# Patient Record
Sex: Female | Born: 1995 | Race: Black or African American | Hispanic: No | Marital: Single | Smoking: Current every day smoker
Health system: Southern US, Community
[De-identification: ages and names within clinical notes are randomized; demographics above are authoritative.]

## PROBLEM LIST (undated history)

## (undated) DIAGNOSIS — N939 Abnormal uterine and vaginal bleeding, unspecified: Secondary | ICD-10-CM

## (undated) DIAGNOSIS — L0291 Cutaneous abscess, unspecified: Secondary | ICD-10-CM

## (undated) HISTORY — DX: Cutaneous abscess, unspecified: L02.91

## (undated) HISTORY — PX: TONSILLECTOMY: SUR1361

---
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
## 2017-11-18 ENCOUNTER — Encounter (HOSPITAL_BASED_OUTPATIENT_CLINIC_OR_DEPARTMENT_OTHER): Payer: Self-pay | Admitting: *Deleted

## 2017-11-18 ENCOUNTER — Other Ambulatory Visit: Payer: Self-pay

## 2017-11-18 ENCOUNTER — Emergency Department (HOSPITAL_BASED_OUTPATIENT_CLINIC_OR_DEPARTMENT_OTHER)
Admission: EM | Admit: 2017-11-18 | Discharge: 2017-11-18 | Disposition: A | Discharge status: Home / Self Care | Payer: Medicaid Other | Attending: Emergency Medicine | Admitting: Emergency Medicine

## 2017-11-18 DIAGNOSIS — Z79899 Other long term (current) drug therapy: Secondary | ICD-10-CM | POA: Insufficient documentation

## 2017-11-18 DIAGNOSIS — R109 Unspecified abdominal pain: Secondary | ICD-10-CM | POA: Insufficient documentation

## 2017-11-18 DIAGNOSIS — N939 Abnormal uterine and vaginal bleeding, unspecified: Secondary | ICD-10-CM | POA: Diagnosis present

## 2017-11-18 DIAGNOSIS — M549 Dorsalgia, unspecified: Secondary | ICD-10-CM | POA: Insufficient documentation

## 2017-11-18 LAB — URINALYSIS, MICROSCOPIC (REFLEX)

## 2017-11-18 LAB — WET PREP, GENITAL
CLUE CELLS WET PREP: NONE SEEN
SPERM: NONE SEEN
TRICH WET PREP: NONE SEEN
YEAST WET PREP: NONE SEEN

## 2017-11-18 LAB — URINALYSIS, ROUTINE W REFLEX MICROSCOPIC
Bilirubin Urine: NEGATIVE
Glucose, UA: NEGATIVE mg/dL
Ketones, ur: NEGATIVE mg/dL
LEUKOCYTES UA: NEGATIVE
Nitrite: NEGATIVE
PROTEIN: NEGATIVE mg/dL
SPECIFIC GRAVITY, URINE: 1.01 (ref 1.005–1.030)
pH: 6 (ref 5.0–8.0)

## 2017-11-18 LAB — PREGNANCY, URINE: Preg Test, Ur: NEGATIVE

## 2017-11-18 NOTE — ED Notes (Addendum)
Patient stated that she has bilateral flank pain and vaginal bleeding  that started at 1115.  Pt claimed that it hurts intermittently.  Pt went to [facility] 4 or 5 months ago d/t heavy bleeding and MD stated that it probably because of her age.

## 2017-11-18 NOTE — ED Provider Notes (Signed)
[facility] EMERGENCY DEPARTMENT Provider Note   CSN: [id number] Arrival date & time: 11/18/17  1217     History   Chief Complaint Chief Complaint  Patient presents with  . Abdominal Pain  . Vaginal Bleeding    HPI Daralyn Bert is a 22 y.o. female who presents with back pain and vaginal bleeding.  She states that she had a period approximately 10 days ago which was normal.  Today she started having vaginal bleeding.  She has  seen several clots when she goes to the bathroom.  Additionally she has had bilateral flank pain.  This is intermittent and not very painful.  Nothing makes it better or worse.  She states she was seen at [facility] in August or something similar and had an ultrasound.  She was given oral contraceptives and her symptoms improved.  She has not had a recurrence of this issue until now.  She is sexually active with one partner and does not use protection.  She does not use birth control.  She denies fever, abdominal pain, nausea, vomiting, blood in the stool, blood in the urine, vaginal discharge.  HPI  History reviewed. No pertinent past medical history.  There are no active problems to display for this patient.   Past Surgical History:  Procedure Laterality Date  . TONSILLECTOMY       OB History   None      Home Medications    Prior to Admission medications   Medication Sig Start Date End Date Taking? Authorizing Provider  albuterol (PROVENTIL HFA;VENTOLIN HFA) 108 (90 BASE) MCG/ACT inhaler Inhale 2 puffs into the lungs every 4 (four) hours as needed for wheezing. 07/03/12   Rancour, Jeannett Senior, MD  azithromycin (ZITHROMAX Z-PAK) 250 MG tablet 2 tabs PO today, then 1 tab PO daily 07/03/12   Glynn Octave, MD    Family History No family history on file.  Social History Social History   Tobacco Use  . Smoking status: Never Smoker  . Smokeless tobacco: Current User  Substance Use Topics  . Alcohol use: No  . Drug  use: No     Allergies   Patient has no known allergies.   Review of Systems Review of Systems  Constitutional: Negative for fever.  Respiratory: Negative for shortness of breath.   Cardiovascular: Negative for chest pain.  Gastrointestinal: Negative for abdominal pain, blood in stool, diarrhea, nausea and vomiting.  Genitourinary: Positive for flank pain and vaginal bleeding. Negative for difficulty urinating, dyspareunia, dysuria, hematuria, pelvic pain, vaginal discharge and vaginal pain.  Hematological: Does not bruise/bleed easily.  All other systems reviewed and are negative.    Physical Exam Updated Vital Signs BP 122/85   Pulse 76   Temp 98.2 F (36.8 C) (Oral)   Resp 20   Ht 5\' 2"  (1.575 m)   Wt 95.3 kg (210 lb)   LMP 11/18/2017   SpO2 100%   BMI 38.41 kg/m   Physical Exam  Constitutional: She is oriented to person, place, and time. She appears well-developed and well-nourished. No distress.  Calm, cooperative, overweight female in no acute distress  HENT:  Head: Normocephalic and atraumatic.  Eyes: Pupils are equal, round, and reactive to light. Conjunctivae are normal. Right eye exhibits no discharge. Left eye exhibits no discharge. No scleral icterus.  Neck: Normal range of motion.  Cardiovascular: Normal rate and regular rhythm.  Pulmonary/Chest: Effort normal and breath sounds normal. No respiratory distress.  Abdominal: Soft. Bowel sounds are  normal. She exhibits no distension. There is no tenderness.  No CVA tenderness  Genitourinary:  Genitourinary Comments: No inguinal lymphadenopathy or inguinal hernia noted. Normal external genitalia. No pain with speculum insertion. Closed cervical os with normal appearance - no rash or lesions. Moderate amount of bleeding in vaginal vault. On bimanual examination no adnexal tenderness or cervical motion tenderness. Chaperone present during exam.    Neurological: She is alert and oriented to person, place, and time.   Skin: Skin is warm and dry.  Psychiatric: She has a normal mood and affect. Her behavior is normal.  Nursing note and vitals reviewed.    ED Treatments / Results  Labs (all labs ordered are listed, but only abnormal results are displayed) Labs Reviewed  WET PREP, GENITAL - Abnormal; Notable for the following components:      Result Value   WBC, Wet Prep HPF POC FEW (*)    All other components within normal limits  URINALYSIS, ROUTINE W REFLEX MICROSCOPIC - Abnormal; Notable for the following components:   APPearance HAZY (*)    Hgb urine dipstick LARGE (*)    All other components within normal limits  URINALYSIS, MICROSCOPIC (REFLEX) - Abnormal; Notable for the following components:   Bacteria, UA RARE (*)    Squamous Epithelial / LPF 0-5 (*)    All other components within normal limits  PREGNANCY, URINE  GC/CHLAMYDIA PROBE AMP ([place]    EKG None  Radiology No results found.  Procedures Procedures (including critical care time)  Medications Ordered in ED Medications - No data to display   Initial Impression / Assessment and Plan / ED Course  I have reviewed the triage vital signs and the nursing notes.  Pertinent labs & imaging results that were available during my care of the patient were reviewed by me and considered in my medical decision making (see chart for details).  22 year old female presents with vaginal bleeding.  Her vital signs are normal.  On exam she is well-appearing.  She has no CVA tenderness her abdomen is nontender she has no pelvic tenderness.  She does have bleeding from the cervix which is mild to moderate.  She is not pregnant.  Her urinalysis does not show evidence of infection.  Her wet prep is normal.  Gonorrhea and chlamydia were sent off.  She was advised to take ibuprofen and to follow-up with her OB/GYN.  She was advised to return if bleeding is persistent or worsening.  Final Clinical Impressions(s) / ED Diagnoses    Final diagnoses:  Vaginal bleeding    ED Discharge Orders    None       Bethel BornGekas, Kelly Marie, PA-C 11/18/17 1524    Raeford RazorKohut, Stephen, MD 11/19/17 [phone]

## 2017-11-18 NOTE — ED Triage Notes (Signed)
Abdominal pain and abnormal vaginal bleeding. States she had a period 10 days ago and today she is having another period per pt.

## 2017-11-18 NOTE — Discharge Instructions (Signed)
Please take Ibuprofen for bleeding/pain Return if worsening

## 2017-11-19 LAB — GC/CHLAMYDIA PROBE AMP (~~LOC~~) NOT AT ARMC
Chlamydia: NEGATIVE
Neisseria Gonorrhea: NEGATIVE

## 2017-12-15 ENCOUNTER — Emergency Department (HOSPITAL_COMMUNITY)
Admission: EM | Admit: 2017-12-15 | Discharge: 2017-12-15 | Disposition: A | Discharge status: Home / Self Care | Payer: Medicaid Other | Attending: Emergency Medicine | Admitting: Emergency Medicine

## 2017-12-15 ENCOUNTER — Emergency Department (HOSPITAL_COMMUNITY): Payer: Medicaid Other

## 2017-12-15 ENCOUNTER — Encounter (HOSPITAL_COMMUNITY): Payer: Self-pay | Admitting: Radiology

## 2017-12-15 DIAGNOSIS — M542 Cervicalgia: Secondary | ICD-10-CM | POA: Diagnosis not present

## 2017-12-15 DIAGNOSIS — S0990XA Unspecified injury of head, initial encounter: Secondary | ICD-10-CM | POA: Diagnosis present

## 2017-12-15 DIAGNOSIS — S299XXA Unspecified injury of thorax, initial encounter: Secondary | ICD-10-CM | POA: Insufficient documentation

## 2017-12-15 DIAGNOSIS — S199XXA Unspecified injury of neck, initial encounter: Secondary | ICD-10-CM | POA: Insufficient documentation

## 2017-12-15 DIAGNOSIS — Y9389 Activity, other specified: Secondary | ICD-10-CM | POA: Diagnosis not present

## 2017-12-15 DIAGNOSIS — Y92410 Unspecified street and highway as the place of occurrence of the external cause: Secondary | ICD-10-CM | POA: Diagnosis not present

## 2017-12-15 DIAGNOSIS — Y999 Unspecified external cause status: Secondary | ICD-10-CM | POA: Diagnosis not present

## 2017-12-15 DIAGNOSIS — S3991XA Unspecified injury of abdomen, initial encounter: Secondary | ICD-10-CM | POA: Diagnosis not present

## 2017-12-15 DIAGNOSIS — S161XXA Strain of muscle, fascia and tendon at neck level, initial encounter: Secondary | ICD-10-CM | POA: Insufficient documentation

## 2017-12-15 LAB — I-STAT CHEM 8, ED
BUN: 5 mg/dL — ABNORMAL LOW (ref 6–20)
CHLORIDE: 104 mmol/L (ref 101–111)
CREATININE: 0.7 mg/dL (ref 0.44–1.00)
Calcium, Ion: 1.18 mmol/L (ref 1.15–1.40)
GLUCOSE: 87 mg/dL (ref 65–99)
HCT: 41 % (ref 36.0–46.0)
HEMOGLOBIN: 13.9 g/dL (ref 12.0–15.0)
POTASSIUM: 3.7 mmol/L (ref 3.5–5.1)
Sodium: 141 mmol/L (ref 135–145)
TCO2: 22 mmol/L (ref 22–32)

## 2017-12-15 LAB — I-STAT BETA HCG BLOOD, ED (MC, WL, AP ONLY): I-stat hCG, quantitative: <5 m[IU]/mL (ref <5)

## 2017-12-15 MED ORDER — CYCLOBENZAPRINE HCL 10 MG PO TABS: 10.0000 mg | ORAL_TABLET | Freq: Three times a day (TID) | ORAL | 0 refills | Status: DC | PRN

## 2017-12-15 MED ORDER — MORPHINE SULFATE (PF) 4 MG/ML IV SOLN
4.0000 mg | Freq: Once | INTRAVENOUS | Status: AC
  Administered 2017-12-15: 4 mg via INTRAVENOUS
  Filled 2017-12-15: qty 1

## 2017-12-15 MED ORDER — IBUPROFEN 800 MG PO TABS: 800.0000 mg | ORAL_TABLET | Freq: Three times a day (TID) | ORAL | 0 refills | Status: DC | PRN

## 2017-12-15 MED ORDER — IOHEXOL 300 MG/ML  SOLN
100.0000 mL | Freq: Once | INTRAMUSCULAR | Status: AC | PRN
  Administered 2017-12-15: 100 mL via INTRAVENOUS
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
## 2017-12-15 NOTE — ED Notes (Signed)
Patient transported to MRI 

## 2017-12-15 NOTE — ED Notes (Signed)
Patient transported to X-ray 

## 2017-12-15 NOTE — ED Notes (Signed)
Patient transported to CT 

## 2017-12-15 NOTE — ED Notes (Signed)
Two unsuccessful attempts on starting a saline lock line.

## 2017-12-15 NOTE — ED Notes (Signed)
Pt ambulated self efficiently to restroom with no difficulty. 
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
## 2017-12-15 NOTE — Discharge Instructions (Addendum)
Your imaging is negative for serious traumatic injury.  Follow-up with your doctor.  Take ibuprofen or Tylenol as needed for pain.  Return to the ED if you develop new or worsening symptoms.

## 2017-12-15 NOTE — ED Notes (Signed)
ED Provider at bedside. 
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
## 2018-01-24 ENCOUNTER — Other Ambulatory Visit (HOSPITAL_COMMUNITY): Payer: Self-pay | Admitting: Family

## 2019-07-02 ENCOUNTER — Emergency Department (HOSPITAL_BASED_OUTPATIENT_CLINIC_OR_DEPARTMENT_OTHER)
Admission: EM | Admit: 2019-07-02 | Discharge: 2019-07-02 | Disposition: A | Discharge status: Home / Self Care | Payer: Medicaid Other | Attending: Emergency Medicine | Admitting: Emergency Medicine

## 2019-07-02 ENCOUNTER — Other Ambulatory Visit: Payer: Self-pay

## 2019-07-02 ENCOUNTER — Encounter (HOSPITAL_BASED_OUTPATIENT_CLINIC_OR_DEPARTMENT_OTHER): Payer: Self-pay

## 2019-07-02 DIAGNOSIS — N939 Abnormal uterine and vaginal bleeding, unspecified: Secondary | ICD-10-CM | POA: Insufficient documentation

## 2019-07-02 DIAGNOSIS — Z91013 Allergy to seafood: Secondary | ICD-10-CM | POA: Insufficient documentation

## 2019-07-02 DIAGNOSIS — F17210 Nicotine dependence, cigarettes, uncomplicated: Secondary | ICD-10-CM | POA: Insufficient documentation

## 2019-07-02 LAB — URINALYSIS, ROUTINE W REFLEX MICROSCOPIC
Bilirubin Urine: NEGATIVE
Glucose, UA: NEGATIVE mg/dL
Ketones, ur: 15 mg/dL — AB
Nitrite: NEGATIVE
Protein, ur: NEGATIVE mg/dL
Specific Gravity, Urine: 1.02 (ref 1.005–1.030)
pH: 6.5 (ref 5.0–8.0)

## 2019-07-02 LAB — WET PREP, GENITAL
Sperm: NONE SEEN
Trich, Wet Prep: NONE SEEN
Yeast Wet Prep HPF POC: NONE SEEN

## 2019-07-02 LAB — PREGNANCY, URINE: Preg Test, Ur: NEGATIVE

## 2019-07-02 LAB — URINALYSIS, MICROSCOPIC (REFLEX)

## 2019-07-02 MED ORDER — IBUPROFEN 800 MG PO TABS: 800.0000 mg | ORAL_TABLET | Freq: Three times a day (TID) | ORAL | 0 refills | Status: AC

## 2019-07-02 MED ORDER — METRONIDAZOLE 500 MG PO TABS: 500.0000 mg | ORAL_TABLET | Freq: Two times a day (BID) | ORAL | 0 refills | Status: AC

## 2019-07-02 NOTE — ED Triage Notes (Signed)
Pt c/o vaginal bleeding since 11/5-NAD-steady gait

## 2019-07-02 NOTE — Discharge Instructions (Signed)
The lab tests show a bacterial vaginosis infection.  You should take the Flagyl as prescribed.  If the gonorrhea and Chlamydia test come back positive, you will receive a phone call and further instructions tomorrow.  Please take the prescribed course of prescription strength ibuprofen for your cramping and bleeding.  Please call the OB/GYN office to schedule follow-up appointment.  If this prescription does not work, you may need to be placed on birth control or other medication.  If your bleeding significantly increases, you develop chest pain, difficulty breathing, passing out or other new concerning symptom recommend return to ER for reassessment.

## 2019-07-02 NOTE — ED Provider Notes (Signed)
[facility] EMERGENCY DEPARTMENT Provider Note   CSN: [id number] Arrival date & time: 07/02/19  2049     History   Chief Complaint Chief Complaint  Patient presents with  . Vaginal Bleeding    HPI Rose Owens is a 23 y.o. female.  Presents to ER with vaginal bleeding.  Patient states that she has had intermittent vaginal bleeding since November 5.  Reports prior history of vaginal bleeding issues.  States bleeding is intermittent, not severe, has gone through 2 tampons today, not soaked.  No clots.  Intermittent cramping, similar to.  Cramping.  Not currently having any pain.  No new vaginal discharge, no dysuria or hematuria.     HPI  History reviewed. No pertinent past medical history.  There are no active problems to display for this patient.   Past Surgical History:  Procedure Laterality Date  . TONSILLECTOMY       OB History   No obstetric history on file.      Home Medications    Prior to Admission medications   Medication Sig Start Date End Date Taking? Authorizing Provider  albuterol (PROVENTIL HFA;VENTOLIN HFA) 108 (90 BASE) MCG/ACT inhaler Inhale 2 puffs into the lungs every 4 (four) hours as needed for wheezing. Patient not taking: Reported on 12/15/2017 07/03/12   Ezequiel Essex, MD  azithromycin (ZITHROMAX Z-PAK) 250 MG tablet 2 tabs PO today, then 1 tab PO daily Patient not taking: Reported on 12/15/2017 07/03/12   Ezequiel Essex, MD  cyclobenzaprine (FLEXERIL) 10 MG tablet Take 1 tablet (10 mg total) by mouth 3 (three) times daily as needed for muscle spasms. 12/15/17   Hayden Rasmussen, MD  ibuprofen (ADVIL,MOTRIN) 800 MG tablet Take 1 tablet (800 mg total) by mouth every 8 (eight) hours as needed. 12/15/17   Hayden Rasmussen, MD    Family History No family history on file.  Social History Social History   Tobacco Use  . Smoking status: Current Every Day Smoker    Types: Cigarettes  . Smokeless tobacco: Never Used  Substance Use  Topics  . Alcohol use: No  . Drug use: No     Allergies   Crab [shellfish allergy]   Review of Systems Review of Systems  Constitutional: Negative for chills and fever.  HENT: Negative for ear pain and sore throat.   Eyes: Negative for pain and visual disturbance.  Respiratory: Negative for cough and shortness of breath.   Cardiovascular: Negative for chest pain and palpitations.  Gastrointestinal: Negative for abdominal pain and vomiting.  Genitourinary: Positive for vaginal bleeding. Negative for dysuria and hematuria.  Musculoskeletal: Negative for arthralgias and back pain.  Skin: Negative for color change and rash.  Neurological: Negative for seizures and syncope.  All other systems reviewed and are negative.    Physical Exam Updated Vital Signs BP 125/79 (BP Location: Left Arm)   Pulse 80   Temp 98.7 F (37.1 C) (Oral)   Resp 20   Ht 5\' 2"  (1.575 m)   Wt 102.1 kg   LMP 06/18/2019   SpO2 99%   BMI 41.15 kg/m   Physical Exam Vitals signs and nursing note reviewed.  Constitutional:      General: She is not in acute distress.    Appearance: She is well-developed.  HENT:     Head: Normocephalic and atraumatic.  Eyes:     Conjunctiva/sclera: Conjunctivae normal.  Neck:     Musculoskeletal: Neck supple.  Cardiovascular:     Rate and Rhythm:  Normal rate and regular rhythm.     Heart sounds: No murmur.  Pulmonary:     Effort: Pulmonary effort is normal. No respiratory distress.     Breath sounds: Normal breath sounds.  Abdominal:     Palpations: Abdomen is soft.     Tenderness: There is no abdominal tenderness.  Genitourinary:    Comments: Small blood noted in vagina, cervix appears normal, no active bleeding, no clots, no pooling, no significant vaginal discharge appreciated, no adnexal tenderness, no adnexal mass, no cervical motion tenderness Musculoskeletal:        General: No swelling or tenderness.  Skin:    General: Skin is warm and dry.      Capillary Refill: Capillary refill takes less than 2 seconds.  Neurological:     General: No focal deficit present.     Mental Status: She is alert and oriented to person, place, and time.      ED Treatments / Results  Labs (all labs ordered are listed, but only abnormal results are displayed) Labs Reviewed  WET PREP, GENITAL - Abnormal; Notable for the following components:      Result Value   Clue Cells Wet Prep HPF POC PRESENT (*)    WBC, Wet Prep HPF POC MANY (*)    All other components within normal limits  URINALYSIS, ROUTINE W REFLEX MICROSCOPIC - Abnormal; Notable for the following components:   Hgb urine dipstick MODERATE (*)    Ketones, ur 15 (*)    Leukocytes,Ua TRACE (*)    All other components within normal limits  URINALYSIS, MICROSCOPIC (REFLEX) - Abnormal; Notable for the following components:   Bacteria, UA RARE (*)    All other components within normal limits  PREGNANCY, URINE  GC/CHLAMYDIA PROBE AMP ([place]) NOT AT [facility]    EKG None  Radiology No results found.  Procedures Procedures (including critical care time)  Medications Ordered in ED Medications - No data to display   Initial Impression / Assessment and Plan / ED Course  I have reviewed the triage vital signs and the nursing notes.  Pertinent labs & imaging results that were available during my care of the patient were reviewed by me and considered in my medical decision making (see chart for details).        23 year old presents to ER with vaginal bleeding.  On exam well-appearing, normal vital signs.  No systemic symptoms.  Prior history of similar.  Has not taken any medicines for this issue.  On exam noted small blood, no active bleeding, no pulling.  Believe patient would benefit from trial of NSAID therapy.  I recommend patient follow-up with her OB/GYN doctor for further assistance in managing this issue.  Wet prep showed clue cells, will treat for BV.  Sent off GC and chlamydia.   If these are positive, patient will need further instructions and antibiotics.    After the discussed management above, the patient was determined to be safe for discharge.  The patient was in agreement with this plan and all questions regarding their care were answered.  ED return precautions were discussed and the patient will return to the ED with any significant worsening of condition.   Final Clinical Impressions(s) / ED Diagnoses   Final diagnoses:  Vaginal bleeding    ED Discharge Orders    None       Milagros Loll, MD 07/03/19 [phone]

## 2019-07-06 ENCOUNTER — Encounter (HOSPITAL_BASED_OUTPATIENT_CLINIC_OR_DEPARTMENT_OTHER): Payer: Self-pay | Admitting: *Deleted

## 2019-07-06 ENCOUNTER — Other Ambulatory Visit: Payer: Self-pay

## 2019-07-06 DIAGNOSIS — F17210 Nicotine dependence, cigarettes, uncomplicated: Secondary | ICD-10-CM | POA: Insufficient documentation

## 2019-07-06 DIAGNOSIS — A549 Gonococcal infection, unspecified: Secondary | ICD-10-CM | POA: Insufficient documentation

## 2019-07-06 LAB — GC/CHLAMYDIA PROBE AMP (~~LOC~~) NOT AT ARMC
Chlamydia: NEGATIVE
Neisseria Gonorrhea: POSITIVE — AB

## 2019-07-06 NOTE — ED Triage Notes (Addendum)
Pt here or STD tx seen 11/19 , called today with results of gonorrhea

## 2019-07-07 ENCOUNTER — Emergency Department (HOSPITAL_BASED_OUTPATIENT_CLINIC_OR_DEPARTMENT_OTHER)
Admission: EM | Admit: 2019-07-07 | Discharge: 2019-07-07 | Disposition: A | Discharge status: Home / Self Care | Payer: Medicaid Other | Attending: Emergency Medicine | Admitting: Emergency Medicine

## 2019-07-07 DIAGNOSIS — A549 Gonococcal infection, unspecified: Secondary | ICD-10-CM

## 2019-07-07 MED ORDER — AZITHROMYCIN 250 MG PO TABS
1000.0000 mg | ORAL_TABLET | Freq: Once | ORAL | Status: AC
  Administered 2019-07-07: 1000 mg via ORAL
  Filled 2019-07-07: qty 4

## 2019-07-07 MED ORDER — CEFTRIAXONE SODIUM 250 MG IJ SOLR
250.0000 mg | Freq: Once | INTRAMUSCULAR | Status: AC
  Administered 2019-07-07: 02:00:00 250 mg via INTRAMUSCULAR
  Filled 2019-07-07: qty 250

## 2019-07-07 NOTE — ED Provider Notes (Signed)
[facility] EMERGENCY DEPARTMENT Provider Note  CSN: [id number] Arrival date & time: 07/06/19 2343  Chief Complaint(s) Exposure to STD  HPI Rose Owens is a 23 y.o. female recently evaluated for vaginal bleeding tested positive for gonorrhea.  Reports that Rose Owens has now developed discharge.  Denies any suprapubic or pelvic discomfort.  No other additional complaints.  HPI  Past Medical History History reviewed. No pertinent past medical history. There are no active problems to display for this patient.  Home Medication(s) Prior to Admission medications   Medication Sig Start Date End Date Taking? Authorizing Provider  albuterol (PROVENTIL HFA;VENTOLIN HFA) 108 (90 BASE) MCG/ACT inhaler Inhale 2 puffs into the lungs every 4 (four) hours as needed for wheezing. Patient not taking: Reported on 12/15/2017 07/03/12   Glynn Octaveancour, Stephen, MD  azithromycin (ZITHROMAX Z-PAK) 250 MG tablet 2 tabs PO today, then 1 tab PO daily Patient not taking: Reported on 12/15/2017 07/03/12   Glynn Octaveancour, Stephen, MD  cyclobenzaprine (FLEXERIL) 10 MG tablet Take 1 tablet (10 mg total) by mouth 3 (three) times daily as needed for muscle spasms. 12/15/17   Terrilee FilesButler, Michael C, MD  ibuprofen (ADVIL) 800 MG tablet Take 1 tablet (800 mg total) by mouth 3 (three) times daily for 7 days. 07/02/19 07/09/19  Milagros Lollykstra, Richard S, MD  metroNIDAZOLE (FLAGYL) 500 MG tablet Take 1 tablet (500 mg total) by mouth 2 (two) times daily for 7 days. 07/02/19 07/09/19  Milagros Lollykstra, Richard S, MD                                                                                                                                    Past Surgical History Past Surgical History:  Procedure Laterality Date  . TONSILLECTOMY     Family History History reviewed. No pertinent family history.  Social History Social History   Tobacco Use  . Smoking status: Current Every Day Smoker    Packs/day: 0.50    Types: Cigarettes  . Smokeless tobacco:  Never Used  Substance Use Topics  . Alcohol use: No  . Drug use: No   Allergies Crab [shellfish allergy]  Review of Systems Review of Systems All other systems are reviewed and are negative for acute change except as noted in the HPI  Physical Exam Vital Signs  I have reviewed the triage vital signs BP 123/90   Pulse 74   Temp 98.5 F (36.9 C)   Resp 16   Ht 5\' 2"  (1.575 m)   Wt 102 kg   LMP 06/18/2019   SpO2 100%   BMI 41.13 kg/m   Physical Exam Vitals signs reviewed.  Constitutional:      General: Rose Owens is not in acute distress.    Appearance: Rose Owens is well-developed. Rose Owens is not diaphoretic.  HENT:     Head: Normocephalic and atraumatic.     Right Ear: External ear normal.     Left Ear: External ear normal.  Nose: Nose normal.  Eyes:     General: No scleral icterus.    Conjunctiva/sclera: Conjunctivae normal.  Neck:     Musculoskeletal: Normal range of motion.     Trachea: Phonation normal.  Cardiovascular:     Rate and Rhythm: Normal rate and regular rhythm.  Pulmonary:     Effort: Pulmonary effort is normal. No respiratory distress.     Breath sounds: No stridor.  Abdominal:     General: There is no distension.     Tenderness: There is no abdominal tenderness.  Musculoskeletal: Normal range of motion.  Neurological:     Mental Status: Rose Owens is alert and oriented to person, place, and time.  Psychiatric:        Behavior: Behavior normal.     ED Results and Treatments Labs (all labs ordered are listed, but only abnormal results are displayed) Labs Reviewed - No data to display                                                                                                                       EKG  EKG Interpretation  Date/Time:    Ventricular Rate:    PR Interval:    QRS Duration:   QT Interval:    QTC Calculation:   R Axis:     Text Interpretation:        Radiology No results found.  Pertinent labs & imaging results that were available  during my care of the patient were reviewed by me and considered in my medical decision making (see chart for details).  Medications Ordered in ED Medications  cefTRIAXone (ROCEPHIN) injection 250 mg (250 mg Intramuscular Given 07/07/19 0200)  azithromycin (ZITHROMAX) tablet 1,000 mg (1,000 mg Oral Given 07/07/19 0200)                                                                                                                                    Procedures Procedures  (including critical care time)  Medical Decision Making / ED Course I have reviewed the nursing notes for this encounter and the patient's prior records (if available in EHR or on provided paperwork).   Rose Owens was evaluated in Emergency Department on 07/07/2019 for the symptoms described in the history of present illness. Rose Owens was evaluated in the context of the global COVID-19 pandemic, which necessitated consideration that the patient might be at risk for infection with the  SARS-CoV-2 virus that causes COVID-19. Institutional protocols and algorithms that pertain to the evaluation of patients at risk for COVID-19 are in a state of rapid change based on information released by regulatory bodies including the CDC and federal and state organizations. These policies and algorithms were followed during the patient's care in the ED.  Tested positive for gonorrhea during prior encounter.  Treated with Rocephin and azithromycin.   The patient appears reasonably screened and/or stabilized for discharge and I doubt any other medical condition or other [facility] requiring further screening, evaluation, or treatment in the ED at this time prior to discharge.  The patient is safe for discharge with strict return precautions.       Final Clinical Impression(s) / ED Diagnoses Final diagnoses:  Gonorrhea     The patient appears reasonably screened and/or stabilized for discharge and I doubt any other medical condition or other  [facility] requiring further screening, evaluation, or treatment in the ED at this time prior to discharge.  Disposition: Discharge  Condition: Good  I have discussed the results, Dx and Tx plan with the patient who expressed understanding and agree(s) with the plan. Discharge instructions discussed at great length. The patient was given strict return precautions who verbalized understanding of the instructions. No further questions at time of discharge.    ED Discharge Orders    None       Follow Up: Department, [facility] [address] [place] [address] [phone]  In 2 weeks for retest to ensure appropriate treatment     This chart was dictated using voice recognition software.  Despite best efforts to proofread,  errors can occur which can change the documentation meaning.   Nira Conn, MD 07/07/19 [phone]

## 2019-08-26 ENCOUNTER — Encounter (HOSPITAL_BASED_OUTPATIENT_CLINIC_OR_DEPARTMENT_OTHER): Payer: Self-pay | Admitting: *Deleted

## 2019-08-26 ENCOUNTER — Emergency Department (HOSPITAL_BASED_OUTPATIENT_CLINIC_OR_DEPARTMENT_OTHER)
Admission: EM | Admit: 2019-08-26 | Discharge: 2019-08-27 | Disposition: A | Discharge status: Home / Self Care | Payer: Medicaid Other | Attending: Emergency Medicine | Admitting: Emergency Medicine

## 2019-08-26 ENCOUNTER — Other Ambulatory Visit: Payer: Self-pay

## 2019-08-26 DIAGNOSIS — F17210 Nicotine dependence, cigarettes, uncomplicated: Secondary | ICD-10-CM | POA: Insufficient documentation

## 2019-08-26 DIAGNOSIS — N938 Other specified abnormal uterine and vaginal bleeding: Secondary | ICD-10-CM | POA: Insufficient documentation

## 2019-08-26 LAB — PREGNANCY, URINE: Preg Test, Ur: NEGATIVE

## 2019-08-26 LAB — WET PREP, GENITAL
Clue Cells Wet Prep HPF POC: NONE SEEN
Sperm: NONE SEEN
Trich, Wet Prep: NONE SEEN
Yeast Wet Prep HPF POC: NONE SEEN

## 2019-08-26 NOTE — ED Notes (Signed)
ED Provider at bedside. 

## 2019-08-26 NOTE — ED Provider Notes (Signed)
[place] DEPT [facility] Provider Note: Georgena Spurling, MD, FACEP  CSN: [id number] MRN: [id number] ARRIVAL: 08/26/19 at 2312 ROOM: [place]  Vaginal Bleeding   HISTORY OF PRESENT ILLNESS  08/26/19 11:28 PM Rose Owens is a 24 y.o. female with 3 weeks of vaginal bleeding.  The bleeding has been heavy (heavier than a.)  With occasional passage of clots.  She has had some mild suprapubic discomfort which is cramping in nature.  She has been seen for this and was told she was negative for gonorrhea.  She was given a prescription for birth control pills which she was told to start on Sunday.  The patient is here because she would like to know why she is bleeding.  She denies any Covid symptoms.   History reviewed. No pertinent past medical history.  Past Surgical History:  Procedure Laterality Date  . TONSILLECTOMY      History reviewed. No pertinent family history.  Social History   Tobacco Use  . Smoking status: Current Every Day Smoker    Packs/day: 0.50    Types: Cigarettes  . Smokeless tobacco: Never Used  Substance Use Topics  . Alcohol use: No  . Drug use: No    Prior to Admission medications   Medication Sig Start Date End Date Taking? Authorizing Provider  albuterol (PROVENTIL HFA;VENTOLIN HFA) 108 (90 BASE) MCG/ACT inhaler Inhale 2 puffs into the lungs every 4 (four) hours as needed for wheezing. Patient not taking: Reported on 12/15/2017 07/03/12 08/26/19  Ezequiel Essex, MD    Allergies Otho Darner allergy]   REVIEW OF SYSTEMS  Negative except as noted here or in the History of Present Illness.   PHYSICAL EXAMINATION  Initial Vital Signs Blood pressure 110/75, pulse 78, temperature 98.1 F (36.7 C), temperature source Oral, resp. rate 18, height 5\' 1"  (1.549 m), weight 101.2 kg, last menstrual period 08/05/2019, SpO2 100 %.  Examination General: Well-developed, well-nourished female in no acute distress; appearance consistent  with age of record HENT: normocephalic; atraumatic Eyes: pupils equal, round and reactive to light; extraocular muscles intact Neck: supple Heart: regular rate and rhythm Lungs: clear to auscultation bilaterally Abdomen: soft; nondistended; nontender; bowel sounds present GU: Normal external genitalia; moderate vaginal bleeding; no cervical motion tenderness; no adnexal tenderness Extremities: No deformity; full range of motion; pulses normal Neurologic: Awake, alert and oriented; motor function intact in all extremities and symmetric; no facial droop Skin: Warm and dry Psychiatric: Normal mood and affect   RESULTS  Summary of this visit's results, reviewed and interpreted by myself:   EKG Interpretation  Date/Time:    Ventricular Rate:    PR Interval:    QRS Duration:   QT Interval:    QTC Calculation:   R Axis:     Text Interpretation:        Laboratory Studies: Results for orders placed or performed during the hospital encounter of 08/26/19 (from the past 24 hour(s))  Pregnancy, urine     Status: None   Collection Time: 08/26/19 11:37 PM  Result Value Ref Range   Preg Test, Ur NEGATIVE NEGATIVE  Wet prep, genital     Status: Abnormal   Collection Time: 08/26/19 11:37 PM   Specimen: PATH Cytology Cervicovaginal Ancillary Only  Result Value Ref Range   Yeast Wet Prep HPF POC NONE SEEN NONE SEEN   Trich, Wet Prep NONE SEEN NONE SEEN   Clue Cells Wet Prep HPF POC NONE SEEN NONE SEEN   WBC, Wet  Prep HPF POC FEW (A) NONE SEEN   Sperm NONE SEEN    Imaging Studies: No results found.  ED COURSE and MDM  Nursing notes, initial and subsequent vitals signs, including pulse oximetry, reviewed and interpreted by myself.  Vitals:   08/26/19 2318  BP: 110/75  Pulse: 78  Resp: 18  Temp: 98.1 F (36.7 C)  TempSrc: Oral  SpO2: 100%  Weight: 101.2 kg  Height: 5\' 1"  (1.549 m)   Medications - No data to display  The cause of the patient's dysfunctional uterine bleeding  is unclear but she is not pregnant.  Gonorrhea and Chlamydia testing are pending.  We will have the patient return tomorrow for a pelvic ultrasound to evaluate for fibroids or other pathology.  PROCEDURES  Procedures   ED DIAGNOSES     ICD-10-CM   1. Dysfunctional uterine bleeding  N93.8        Salim Forero, MD 08/26/19 2356

## 2019-08-26 NOTE — ED Triage Notes (Signed)
pt c/o vaginal bleeding x 3 weeks , neg for STD last week

## 2019-08-27 ENCOUNTER — Ambulatory Visit (HOSPITAL_BASED_OUTPATIENT_CLINIC_OR_DEPARTMENT_OTHER): Payer: Self-pay

## 2019-08-28 ENCOUNTER — Ambulatory Visit (HOSPITAL_BASED_OUTPATIENT_CLINIC_OR_DEPARTMENT_OTHER)
Admission: RE | Admit: 2019-08-28 | Discharge: 2019-08-28 | Disposition: A | Discharge status: Home / Self Care | Payer: Self-pay | Source: Ambulatory Visit | Attending: Emergency Medicine | Admitting: Emergency Medicine

## 2019-08-28 ENCOUNTER — Other Ambulatory Visit: Payer: Self-pay

## 2019-08-28 DIAGNOSIS — R9389 Abnormal findings on diagnostic imaging of other specified body structures: Secondary | ICD-10-CM | POA: Insufficient documentation

## 2019-08-28 DIAGNOSIS — N939 Abnormal uterine and vaginal bleeding, unspecified: Secondary | ICD-10-CM | POA: Insufficient documentation

## 2019-08-28 LAB — GC/CHLAMYDIA PROBE AMP (~~LOC~~) NOT AT ARMC
Chlamydia: NEGATIVE
Neisseria Gonorrhea: NEGATIVE

## 2020-05-18 ENCOUNTER — Other Ambulatory Visit: Payer: Self-pay

## 2020-05-18 ENCOUNTER — Emergency Department (HOSPITAL_BASED_OUTPATIENT_CLINIC_OR_DEPARTMENT_OTHER): Payer: Self-pay

## 2020-05-18 ENCOUNTER — Encounter (HOSPITAL_BASED_OUTPATIENT_CLINIC_OR_DEPARTMENT_OTHER): Payer: Self-pay | Admitting: Emergency Medicine

## 2020-05-18 ENCOUNTER — Emergency Department (HOSPITAL_BASED_OUTPATIENT_CLINIC_OR_DEPARTMENT_OTHER)
Admission: EM | Admit: 2020-05-18 | Discharge: 2020-05-18 | Disposition: A | Discharge status: Home / Self Care | Payer: Self-pay | Attending: Emergency Medicine | Admitting: Emergency Medicine

## 2020-05-18 DIAGNOSIS — N939 Abnormal uterine and vaginal bleeding, unspecified: Secondary | ICD-10-CM | POA: Insufficient documentation

## 2020-05-18 DIAGNOSIS — R2241 Localized swelling, mass and lump, right lower limb: Secondary | ICD-10-CM | POA: Insufficient documentation

## 2020-05-18 DIAGNOSIS — F17210 Nicotine dependence, cigarettes, uncomplicated: Secondary | ICD-10-CM | POA: Insufficient documentation

## 2020-05-18 HISTORY — DX: Abnormal uterine and vaginal bleeding, unspecified: N93.9

## 2020-05-18 LAB — CBC
HCT: 44.6 % (ref 36.0–46.0)
Hemoglobin: 14.6 g/dL (ref 12.0–15.0)
MCH: 28 pg (ref 26.0–34.0)
MCHC: 32.7 g/dL (ref 30.0–36.0)
MCV: 85.4 fL (ref 80.0–100.0)
Platelets: 308 10*3/uL (ref 150–400)
RBC: 5.22 MIL/uL — ABNORMAL HIGH (ref 3.87–5.11)
RDW: 14.1 % (ref 11.5–15.5)
WBC: 16.2 10*3/uL — ABNORMAL HIGH (ref 4.0–10.5)
nRBC: 0 % (ref 0.0–0.2)

## 2020-05-18 LAB — PREGNANCY, URINE: Preg Test, Ur: NEGATIVE

## 2020-05-18 MED ORDER — NAPROXEN 500 MG PO TABS: 500.0000 mg | ORAL_TABLET | Freq: Two times a day (BID) | ORAL | 0 refills | Status: AC

## 2020-05-18 NOTE — Discharge Instructions (Signed)
Please follow-up with your OB/GYN, Dr. Okey Dupre, regarding today's encounter.  Given your persistent bleeding despite oral contraceptives, you may ultimately benefit from dilation and curettage procedure.  Please call them.  If they are not able to accommodate you, you may wish to consider scheduling appointment with a different OB/GYN who might be more responsive to your issues with vaginal bleeding.    I have prescribed you naproxen.  Please take as this may decrease the vaginal bleeding when used in conjunction with your oral contraceptive.  Return to the ED or seek immediate medical attention should you experience any new or symptoms.

## 2020-05-18 NOTE — ED Triage Notes (Signed)
Pt sts she has been seen here for this same complaint 3 times already... vaginal bleeding.  Sts she has seen her primary and they put her on birth control 6 months ago but the excessive bleeding is not getting better.  They tell her to "just keep taking the birth control. That is what is going to fix it."  She presents to the Ed today because she wants to know "whats going on."  Also has had lumps on her legs for years but they just recently started hurting and she wants them checked as well.

## 2020-05-18 NOTE — ED Notes (Signed)
Patient transported to X-ray 

## 2020-05-18 NOTE — ED Provider Notes (Addendum)
[facility] EMERGENCY DEPARTMENT Provider Note   CSN: [id number] Arrival date & time: 05/18/20  1214     History Chief Complaint  Patient presents with  . Vaginal Bleeding    Rose Owens is a 24 y.o. female who has been evaluated multiple times in the ER in the past for vaginal bleeding presents to the ED with complaints of continued vaginal bleeding.  She reports that she was placed on birth control by her OB/GYN here in [facility] approximately 6 months ago, but continues to endorse excessive vaginal bleeding.  I reviewed patient's medical record and she had a vaginal ultrasound obtained here in the ED earlier this year which was largely unremarkable and without any obvious structural abnormalities causing her vaginal bleeding.  She states that she has currently been on a period since 04/28/2020 and has to continue using tampons.    Patient is also complaining of discrete masses on her right anterior tibia x2 years that have begun to bother her as of late.  She states that she is unsure if it is due to her standing all day long at her current job.  She denies any redness or swelling.  She also denies any fevers or chills, abdominal pain, pelvic pain, vaginal discharge, lightheadedness or dizziness, dyspareunia, recent sexual intercourse or vaginal trauma, numbness or weakness, or other symptoms.  HPI     Past Medical History:  Diagnosis Date  . Vaginal bleeding     There are no problems to display for this patient.   Past Surgical History:  Procedure Laterality Date  . TONSILLECTOMY       OB History   No obstetric history on file.     No family history on file.  Social History   Tobacco Use  . Smoking status: Current Every Day Smoker    Packs/day: 0.50    Types: Cigarettes  . Smokeless tobacco: Never Used  Vaping Use  . Vaping Use: Never used  Substance Use Topics  . Alcohol use: No  . Drug use: No    Home Medications Prior to Admission  medications   Medication Sig Start Date End Date Taking? Authorizing Provider  naproxen (NAPROSYN) 500 MG tablet Take 1 tablet (500 mg total) by mouth 2 (two) times daily with a meal. 05/18/20   Chilton Si, Sharion Settler, PA-C  albuterol (PROVENTIL HFA;VENTOLIN HFA) 108 (90 BASE) MCG/ACT inhaler Inhale 2 puffs into the lungs every 4 (four) hours as needed for wheezing. Patient not taking: Reported on 12/15/2017 07/03/12 08/26/19  Glynn Octave, MD    Allergies    Parke Simmers allergy]  Review of Systems   Review of Systems  All other systems reviewed and are negative.   Physical Exam Updated Vital Signs BP (!) 143/98   Pulse 83   Temp 98.5 F (36.9 C) (Oral)   Resp 16   Ht 5\' 1"  (1.549 m)   Wt 100.7 kg   LMP 05/06/2020   SpO2 100%   BMI 41.93 kg/m   Physical Exam Vitals and nursing note reviewed. Exam conducted with a chaperone present.  Constitutional:      General: She is not in acute distress.    Appearance: Normal appearance. She is not ill-appearing.  HENT:     Head: Normocephalic and atraumatic.  Eyes:     General: No scleral icterus.    Conjunctiva/sclera: Conjunctivae normal.  Cardiovascular:     Rate and Rhythm: Normal rate and regular rhythm.     Pulses: Normal  pulses.     Heart sounds: Normal heart sounds.  Pulmonary:     Effort: Pulmonary effort is normal.  Abdominal:     Comments: Soft, nondistended.  No peritoneal signs.  No obvious uterine enlargement.  Musculoskeletal:     Cervical back: Normal range of motion and neck supple. No rigidity.     Right lower leg: No edema.     Left lower leg: No edema.  Skin:    General: Skin is dry.     Capillary Refill: Capillary refill takes less than 2 seconds.  Neurological:     Mental Status: She is alert and oriented to person, place, and time.     GCS: GCS eye subscore is 4. GCS verbal subscore is 5. GCS motor subscore is 6.  Psychiatric:        Mood and Affect: Mood normal.        Behavior: Behavior normal.         Thought Content: Thought content normal.      ED Results / Procedures / Treatments   Labs (all labs ordered are listed, but only abnormal results are displayed) Labs Reviewed  CBC - Abnormal; Notable for the following components:      Result Value   WBC 16.2 (*)    RBC 5.22 (*)    All other components within normal limits  PREGNANCY, URINE    EKG None  Radiology DG Tibia/Fibula Right  Result Date: 05/18/2020 CLINICAL DATA:  soft tissue mass, likely sebaceous cyst. Now painful, assess for possible bony lesion as cause of her discrete mass. EXAM: RIGHT TIBIA AND FIBULA - 2 VIEW COMPARISON:  None. FINDINGS: The cortical margins of the tibia and fibula are intact. There is no evidence of fracture or other focal bone lesions. No periosteal reaction or bony destruction. Knee and ankle alignment are maintained. Site of soft tissue mass detected clinically is not well-defined by radiograph. There is no soft tissue air or radiopaque foreign body. IMPRESSION: Unremarkable radiographs of the right tibia and fibula. No focal osseous lesion. Electronically Signed   By: Narda Rutherford M.D.   On: 05/18/2020 15:12    Procedures Procedures (including critical care time)  Medications Ordered in ED Medications - No data to display  ED Course  I have reviewed the triage vital signs and the nursing notes.  Pertinent labs & imaging results that were available during my care of the patient were reviewed by me and considered in my medical decision making (see chart for details).    MDM Rules/Calculators/A&P                          DDx:    Structural - (fibroids, adenomyosis, polyps)       Non-Structural - (coagulopathy, ovulatory dysfunction, malignancy)  The most common cause of abnormal vaginal bleeding is to be uterine bleeding related to ovulatory dysfunction.  Patient denies any significant postcoital pain or bleeding concerning for traumatic laceration.  No abnormal bruising,  bleeding, or family history of coagulopathies.  No petechiae on physical exam and do not feel as though PT/INR or clotting work-up is warranted at this time.  Obtained CBC to evaluate for anemia which was unremarkable.  Leukocytosis to 16.2, but no recent labs with which to compare.  She denies any fevers or symptoms concerning for infection.  Vital signs are reassuring and she is hemodynamically stable.    I obtained and reviewed pelvic US from earlier this  year which was entirely unremarkable.  Given her reassuring physical exam today and lack of any abdominal pain/pelvic discomfort, do not feel as though emergent repeat pelvic ultrasound is warranted.  Patient declines pelvic exam as low suspicion for vaginal wall laceration.    Will encourage her to continue her oral contraceptive medications and also prescribe naproxen 500 mg BID as NSAIDs have been shown to decrease cramping and bleeding by 50%.  Will also refer to her OB/GYN Dr. Okey Dupre as she may ultimately require D&C.  If symptoms fail to improve despite conservative management.  Strict ED return precautions discussed.  All of the evaluation and work-up results were discussed with the patient and any family at bedside. They were provided opportunity to ask any additional questions and have none at this time. They have expressed understanding of verbal discharge instructions as well as return precautions and are agreeable to the plan.   After discharge, patient returned to ED window to ask if [facility] testing could be added. She states that she was last sexually active over three months ago and is denying any urinary symptoms or abdominal/pelvic discomfort. No vaginal discharge.  Will not treat empirically.    Final Clinical Impression(s) / ED Diagnoses Final diagnoses:  Vaginal bleeding    Rx / DC Orders ED Discharge Orders         Ordered    naproxen (NAPROSYN) 500 MG tablet  2 times daily with meals        05/18/20 1553             Lorelee New, PA-C 05/18/20 1554    Lorelee New, PA-C 05/18/20 1623    Tegeler, Canary Brim, MD 05/19/20 [phone]

## 2020-05-19 LAB — GC/CHLAMYDIA PROBE AMP (~~LOC~~) NOT AT ARMC
Chlamydia: NEGATIVE
Comment: NEGATIVE
Comment: NORMAL
Neisseria Gonorrhea: NEGATIVE

## 2020-10-30 ENCOUNTER — Other Ambulatory Visit: Payer: Self-pay

## 2020-10-30 ENCOUNTER — Emergency Department (HOSPITAL_BASED_OUTPATIENT_CLINIC_OR_DEPARTMENT_OTHER)
Admission: EM | Admit: 2020-10-30 | Discharge: 2020-10-31 | Disposition: A | Discharge status: Home / Self Care | Payer: Self-pay | Attending: Emergency Medicine | Admitting: Emergency Medicine

## 2020-10-30 ENCOUNTER — Encounter (HOSPITAL_BASED_OUTPATIENT_CLINIC_OR_DEPARTMENT_OTHER): Payer: Self-pay | Admitting: Emergency Medicine

## 2020-10-30 DIAGNOSIS — K610 Anal abscess: Secondary | ICD-10-CM | POA: Insufficient documentation

## 2020-10-30 DIAGNOSIS — F17210 Nicotine dependence, cigarettes, uncomplicated: Secondary | ICD-10-CM | POA: Insufficient documentation

## 2020-10-30 MED ORDER — LIDOCAINE-EPINEPHRINE-TETRACAINE (LET) TOPICAL GEL
3.0000 mL | Freq: Once | TOPICAL | Status: AC
  Administered 2020-10-30: 3 mL via TOPICAL
  Filled 2020-10-30: qty 3

## 2020-10-30 NOTE — ED Triage Notes (Signed)
Pt reports abscess "in my butthole"; reports it is getting bigger and harder; denies fever; reports recurrent problem

## 2020-10-30 NOTE — ED Notes (Signed)
ED Provider at bedside. Dr. Cardama 

## 2020-10-31 ENCOUNTER — Emergency Department (HOSPITAL_BASED_OUTPATIENT_CLINIC_OR_DEPARTMENT_OTHER): Payer: Self-pay

## 2020-10-31 LAB — CBC WITH DIFFERENTIAL/PLATELET
Abs Immature Granulocytes: 0.04 10*3/uL (ref 0.00–0.07)
Basophils Absolute: 0.1 10*3/uL (ref 0.0–0.1)
Basophils Relative: 0 %
Eosinophils Absolute: 0.4 10*3/uL (ref 0.0–0.5)
Eosinophils Relative: 3 %
HCT: 42.1 % (ref 36.0–46.0)
Hemoglobin: 13.8 g/dL (ref 12.0–15.0)
Immature Granulocytes: 0 %
Lymphocytes Relative: 23 %
Lymphs Abs: 3.8 10*3/uL (ref 0.7–4.0)
MCH: 27.7 pg (ref 26.0–34.0)
MCHC: 32.8 g/dL (ref 30.0–36.0)
MCV: 84.5 fL (ref 80.0–100.0)
Monocytes Absolute: 1 10*3/uL (ref 0.1–1.0)
Monocytes Relative: 6 %
Neutro Abs: 11.4 10*3/uL — ABNORMAL HIGH (ref 1.7–7.7)
Neutrophils Relative %: 68 %
Platelets: 336 10*3/uL (ref 150–400)
RBC: 4.98 MIL/uL (ref 3.87–5.11)
RDW: 14.3 % (ref 11.5–15.5)
WBC: 16.7 10*3/uL — ABNORMAL HIGH (ref 4.0–10.5)
nRBC: 0 % (ref 0.0–0.2)

## 2020-10-31 LAB — BASIC METABOLIC PANEL
Anion gap: 10 (ref 5–15)
BUN: 12 mg/dL (ref 6–20)
CO2: 24 mmol/L (ref 22–32)
Calcium: 8.8 mg/dL — ABNORMAL LOW (ref 8.9–10.3)
Chloride: 104 mmol/L (ref 98–111)
Creatinine, Ser: 0.96 mg/dL (ref 0.44–1.00)
GFR, Estimated: >60 mL/min (ref >60)
Glucose, Bld: 92 mg/dL (ref 70–99)
Potassium: 3.6 mmol/L (ref 3.5–5.1)
Sodium: 138 mmol/L (ref 135–145)

## 2020-10-31 LAB — HCG, SERUM, QUALITATIVE: Preg, Serum: NEGATIVE

## 2020-10-31 MED ORDER — SODIUM CHLORIDE 0.9 % IV BOLUS
1000.0000 mL | Freq: Once | INTRAVENOUS | Status: AC
  Administered 2020-10-31: 1000 mL via INTRAVENOUS

## 2020-10-31 MED ORDER — MORPHINE SULFATE (PF) 4 MG/ML IV SOLN
4.0000 mg | Freq: Once | INTRAVENOUS | Status: AC
  Administered 2020-10-31: 4 mg via INTRAVENOUS
  Filled 2020-10-31: qty 1

## 2020-10-31 MED ORDER — IOHEXOL 300 MG/ML  SOLN
100.0000 mL | Freq: Once | INTRAMUSCULAR | Status: AC | PRN
  Administered 2020-10-31: 100 mL via INTRAVENOUS

## 2020-10-31 MED ORDER — LIDOCAINE-EPINEPHRINE 2 %-1:100000 IJ SOLN: 10.0000 mL | Freq: Once | INTRAMUSCULAR | Status: DC

## 2020-10-31 MED ORDER — LIDOCAINE-EPINEPHRINE (PF) 2 %-1:200000 IJ SOLN
INTRAMUSCULAR | Status: AC
  Administered 2020-10-31: 20 mL
  Filled 2020-10-31: qty 20

## 2020-10-31 NOTE — ED Notes (Signed)
ED Provider at bedside. 

## 2020-10-31 NOTE — ED Provider Notes (Addendum)
[facility] EMERGENCY DEPARTMENT Provider Note  CSN: [id number] Arrival date & time: 10/30/20 2156  Chief Complaint(s) Abscess  HPI Rose Owens is a 25 y.o. female history of recurrent abscesses here for a painful lump near her perianal region.  Severe aching and throbbing pain. Ongoing for several days. Worse in today. Pain worse with palpation, sitting, bowel movements. No alleviating factors. No fevers or chills.  No coughing or congestion.  No other physical complaints.  HPI  Past Medical History Past Medical History:  Diagnosis Date  . Vaginal bleeding    There are no problems to display for this patient.  Home Medication(s) Prior to Admission medications   Medication Sig Start Date End Date Taking? Authorizing Provider  naproxen (NAPROSYN) 500 MG tablet Take 1 tablet (500 mg total) by mouth 2 (two) times daily with a meal. 05/18/20   Chilton Si, Sharion Settler, PA-C  albuterol (PROVENTIL HFA;VENTOLIN HFA) 108 (90 BASE) MCG/ACT inhaler Inhale 2 puffs into the lungs every 4 (four) hours as needed for wheezing. Patient not taking: Reported on 12/15/2017 07/03/12 08/26/19  Glynn Octave, MD                                                                                                                                    Past Surgical History Past Surgical History:  Procedure Laterality Date  . TONSILLECTOMY     Family History No family history on file.  Social History Social History   Tobacco Use  . Smoking status: Current Every Day Smoker    Packs/day: 0.50    Types: Cigarettes  . Smokeless tobacco: Never Used  Vaping Use  . Vaping Use: Never used  Substance Use Topics  . Alcohol use: No  . Drug use: No   Allergies Crab [shellfish allergy]  Review of Systems Review of Systems All other systems are reviewed and are negative for acute change except as noted in the HPI  Physical Exam Vital Signs  I have reviewed the triage vital signs BP 129/74    Pulse 85   Resp 16   Ht 5\' 2"  (1.575 m)   Wt 95.3 kg   LMP 10/24/2020   SpO2 100%   BMI 38.41 kg/m   Physical Exam Vitals reviewed.  Constitutional:      General: She is not in acute distress.    Appearance: She is well-developed. She is not diaphoretic.  HENT:     Head: Normocephalic and atraumatic.     Right Ear: External ear normal.     Left Ear: External ear normal.     Nose: Nose normal.  Eyes:     General: No scleral icterus.    Conjunctiva/sclera: Conjunctivae normal.  Neck:     Trachea: Phonation normal.  Cardiovascular:     Rate and Rhythm: Normal rate and regular rhythm.  Pulmonary:     Effort: Pulmonary effort is normal. No respiratory distress.  Breath sounds: No stridor.  Abdominal:     General: There is no distension.  Genitourinary:   Musculoskeletal:        General: Normal range of motion.     Cervical back: Normal range of motion.  Neurological:     Mental Status: She is alert and oriented to person, place, and time.  Psychiatric:        Behavior: Behavior normal.     ED Results and Treatments Labs (all labs ordered are listed, but only abnormal results are displayed) Labs Reviewed  CBC WITH DIFFERENTIAL/PLATELET - Abnormal; Notable for the following components:      Result Value   WBC 16.7 (*)    Neutro Abs 11.4 (*)    All other components within normal limits  BASIC METABOLIC PANEL - Abnormal; Notable for the following components:   Calcium 8.8 (*)    All other components within normal limits  HCG, SERUM, QUALITATIVE                                                                                                                         EKG  EKG Interpretation  Date/Time:    Ventricular Rate:    PR Interval:    QRS Duration:   QT Interval:    QTC Calculation:   R Axis:     Text Interpretation:        Radiology CT ABDOMEN PELVIS W CONTRAST  Result Date: 10/31/2020 CLINICAL DATA:  25 year old female with perianal abscess.  EXAM: CT ABDOMEN AND PELVIS WITH CONTRAST TECHNIQUE: Multidetector CT imaging of the abdomen and pelvis was performed using the Owens protocol following bolus administration of intravenous contrast. CONTRAST:  100mL OMNIPAQUE IOHEXOL 300 MG/ML  SOLN COMPARISON:  CT dated 12/15/2017. FINDINGS: Lower chest: The visualized lung bases are clear. No intra-abdominal free air or free fluid. Hepatobiliary: Probable mild fatty liver. No intrahepatic biliary ductal dilatation. The gallbladder is unremarkable. Pancreas: Unremarkable. No pancreatic ductal dilatation or surrounding inflammatory changes. Spleen: Normal in size without focal abnormality. Adrenals/Urinary Tract: Adrenal glands are unremarkable. Kidneys are normal, without renal calculi, focal lesion, or hydronephrosis. Bladder is unremarkable. Stomach/Bowel: There is no bowel obstruction or active inflammation. The appendix is normal. Vascular/Lymphatic: The abdominal aorta and IVC unremarkable. No portal venous gas. There is no adenopathy. Reproductive: The uterus is anteverted and grossly unremarkable. A 3.5 cm cystic structure in the region of the left adnexa may represent likely a cyst and less likely dilated fallopian tube. Ultrasound may provide better evaluation of the pelvic structures if clinically indicated. Other: There is mild induration and thickening of the posterior perianal tissues with a faint 9 mm hypodense focus posterior to the anus along the intergluteal cleft, likely a small phlegmon or focal edema. No drainable fluid collection identified. No soft tissue gas. Musculoskeletal: No acute or significant osseous findings. IMPRESSION: 1. Mild thickening of the posterior perianal tissues with a subcentimeter focal edema or phlegmon along the intergluteal cleft. No drainable fluid  collection identified. No soft tissue gas. 2. No bowel obstruction. Normal appendix. Electronically Signed   By: Elgie Collard M.D.   On: 10/31/2020 02:07     Pertinent labs & imaging results that were available during my care of the patient were reviewed by me and considered in my medical decision making (see chart for details).  Medications Ordered in ED Medications  lidocaine-EPINEPHrine (XYLOCAINE W/EPI) 2 %-1:100000 (with pres) injection 10 mL (10 mLs Intradermal Not Given 10/31/20 0108)  lidocaine-EPINEPHrine-tetracaine (LET) topical gel (3 mLs Topical Given 10/30/20 2303)  lidocaine-EPINEPHrine (XYLOCAINE W/EPI) 2 %-1:200000 (PF) injection (20 mLs  Given by Other 10/31/20 0027)  morphine 4 MG/ML injection 4 mg (4 mg Intravenous Given 10/31/20 0111)  sodium chloride 0.9 % bolus 1,000 mL (0 mLs Intravenous Stopped 10/31/20 0245)  iohexol (OMNIPAQUE) 300 MG/ML solution 100 mL (100 mLs Intravenous Contrast Given 10/31/20 0141)                                                                                                                                    Procedures .Marland KitchenIncision and Drainage  Date/Time: 10/31/2020 4:31 AM Performed by: Nira Conn, MD Authorized by: Nira Conn, MD   Consent:    Consent obtained:  Verbal   Consent given by:  Patient   Risks, benefits, and alternatives were discussed: yes     Risks discussed:  Bleeding, incomplete drainage and infection   Alternatives discussed:  No treatment and delayed treatment Universal protocol:    Procedure explained and questions answered to patient or proxy's satisfaction: yes     Relevant documents present and verified: yes     Patient identity confirmed:  Arm band and verbally with patient Location:    Type:  Abscess   Size:  3   Location:  Anogenital   Anogenital location:  Perianal Pre-procedure details:    Skin preparation:  Chlorhexidine with alcohol and povidone-iodine Sedation:    Sedation type:  None Anesthesia:    Anesthesia method:  Topical application and local infiltration   Topical anesthetic:  LET   Local anesthetic:  Lidocaine 2% WITH  epi Procedure type:    Complexity:  Simple Procedure details:    Needle aspiration: yes     Needle size:  18 G   Incision types:  Single straight   Incision depth:  Dermal   Wound management:  Probed and deloculated   Drainage:  Bloody and purulent   Drainage amount:  Moderate   Wound treatment:  Wound left open Post-procedure details:    Procedure completion:  Tolerated well, no immediate complications    (including critical care time)  Medical Decision Making / ED Course I have reviewed the nursing notes for this encounter and the patient's prior records (if available in EHR or on provided paperwork).   Rose Owens was evaluated in Emergency Department on 10/31/2020 for the symptoms described in the history of present  illness. She was evaluated in the context of the global COVID-19 pandemic, which necessitated consideration that the patient might be at risk for infection with the SARS-CoV-2 virus that causes COVID-19. Institutional protocols and algorithms that pertain to the evaluation of patients at risk for COVID-19 are in a state of rapid change based on information released by regulatory bodies including the CDC and federal and state organizations. These policies and algorithms were followed during the patient's care in the ED.  LET applied. Abscess vs thrombosed hemorrhoid. Aspiration retrieve blood purulent substance. Will get CT to insure abscess does not track deeper to the the perirectal region.  CBC with leukocytosis. CT w/o extension of the abscess. I&D at bedside. Recommended Sitz baths.      Final Clinical Impression(s) / ED Diagnoses Final diagnoses:  Anal abscess   The patient appears reasonably screened and/or stabilized for discharge and I doubt any other medical condition or other [facility] requiring further screening, evaluation, or treatment in the ED at this time prior to discharge. Safe for discharge with strict return precautions.  Disposition:  Discharge  Condition: Good  I have discussed the results, Dx and Tx plan with the patient/family who expressed understanding and agree(s) with the plan. Discharge instructions discussed at length. The patient/family was given strict return precautions who verbalized understanding of the instructions. No further questions at time of discharge.    ED Discharge Orders    None       Follow Up: Surgery, [facility] [address] [place] [address] [phone]  Call  to schedule an appointment for close follow up if abscess recurs.      This chart was dictated using voice recognition software.  Despite best efforts to proofread,  errors can occur which can change the documentation meaning.     Nira Conn, MD 10/31/20 [phone]

## 2021-02-02 ENCOUNTER — Emergency Department (HOSPITAL_BASED_OUTPATIENT_CLINIC_OR_DEPARTMENT_OTHER): Payer: Commercial Managed Care - PPO

## 2021-02-02 ENCOUNTER — Encounter (HOSPITAL_BASED_OUTPATIENT_CLINIC_OR_DEPARTMENT_OTHER): Payer: Self-pay | Admitting: Emergency Medicine

## 2021-02-02 ENCOUNTER — Emergency Department (HOSPITAL_BASED_OUTPATIENT_CLINIC_OR_DEPARTMENT_OTHER)
Admission: EM | Admit: 2021-02-02 | Discharge: 2021-02-02 | Disposition: A | Discharge status: Home / Self Care | Payer: Commercial Managed Care - PPO | Attending: Emergency Medicine | Admitting: Emergency Medicine

## 2021-02-02 ENCOUNTER — Other Ambulatory Visit: Payer: Self-pay

## 2021-02-02 DIAGNOSIS — N939 Abnormal uterine and vaginal bleeding, unspecified: Secondary | ICD-10-CM | POA: Diagnosis not present

## 2021-02-02 DIAGNOSIS — A599 Trichomoniasis, unspecified: Secondary | ICD-10-CM | POA: Diagnosis not present

## 2021-02-02 DIAGNOSIS — F17210 Nicotine dependence, cigarettes, uncomplicated: Secondary | ICD-10-CM | POA: Diagnosis not present

## 2021-02-02 DIAGNOSIS — N83202 Unspecified ovarian cyst, left side: Secondary | ICD-10-CM | POA: Diagnosis not present

## 2021-02-02 DIAGNOSIS — R102 Pelvic and perineal pain: Secondary | ICD-10-CM

## 2021-02-02 LAB — GC/CHLAMYDIA PROBE AMP (~~LOC~~) NOT AT ARMC
Chlamydia: NEGATIVE
Comment: NEGATIVE
Comment: NORMAL
Neisseria Gonorrhea: NEGATIVE

## 2021-02-02 LAB — WET PREP, GENITAL
Clue Cells Wet Prep HPF POC: NONE SEEN
Sperm: NONE SEEN
Yeast Wet Prep HPF POC: NONE SEEN

## 2021-02-02 LAB — URINALYSIS, MICROSCOPIC (REFLEX)

## 2021-02-02 LAB — URINALYSIS, ROUTINE W REFLEX MICROSCOPIC
Bilirubin Urine: NEGATIVE
Glucose, UA: NEGATIVE mg/dL
Ketones, ur: NEGATIVE mg/dL
Leukocytes,Ua: NEGATIVE
Nitrite: NEGATIVE
Protein, ur: NEGATIVE mg/dL
Specific Gravity, Urine: 1.01 (ref 1.005–1.030)
pH: 5.5 (ref 5.0–8.0)

## 2021-02-02 LAB — PREGNANCY, URINE: Preg Test, Ur: NEGATIVE

## 2021-02-02 MED ORDER — METRONIDAZOLE 500 MG PO TABS
500.0000 mg | ORAL_TABLET | Freq: Once | ORAL | Status: AC
  Administered 2021-02-02: 500 mg via ORAL
  Filled 2021-02-02: qty 1

## 2021-02-02 MED ORDER — DOXYCYCLINE HYCLATE 100 MG PO TABS
100.0000 mg | ORAL_TABLET | Freq: Once | ORAL | Status: AC
  Administered 2021-02-02: 100 mg via ORAL
  Filled 2021-02-02: qty 1

## 2021-02-02 MED ORDER — METRONIDAZOLE 500 MG PO TABS: 500.0000 mg | ORAL_TABLET | Freq: Two times a day (BID) | ORAL | 0 refills | Status: AC

## 2021-02-02 MED ORDER — DOXYCYCLINE HYCLATE 100 MG PO CAPS: 100.0000 mg | ORAL_CAPSULE | Freq: Two times a day (BID) | ORAL | 0 refills | Status: AC

## 2021-02-02 MED ORDER — CEFTRIAXONE SODIUM 500 MG IJ SOLR
500.0000 mg | Freq: Once | INTRAMUSCULAR | Status: AC
  Administered 2021-02-02: 500 mg via INTRAMUSCULAR
  Filled 2021-02-02: qty 500

## 2021-02-02 NOTE — ED Provider Notes (Addendum)
[facility] EMERGENCY DEPARTMENT Provider Note   CSN: [id number] Arrival date & time: 02/02/21  0700     History No chief complaint on file.   Rose Owens is a 25 y.o. female.  The history is provided by the patient and medical records. No language interpreter was used.  Abdominal Cramping This is a new problem. The current episode started more than 2 days ago. The problem occurs constantly. The problem has not changed since onset.Associated symptoms include abdominal pain. Pertinent negatives include no chest pain, no headaches and no shortness of breath. Nothing aggravates the symptoms. Nothing relieves the symptoms. She has tried nothing for the symptoms. The treatment provided no relief.      Past Medical History:  Diagnosis Date   Vaginal bleeding     There are no problems to display for this patient.   Past Surgical History:  Procedure Laterality Date   TONSILLECTOMY       OB History   No obstetric history on file.     No family history on file.  Social History   Tobacco Use   Smoking status: Every Day    Packs/day: 0.50    Pack years: 0.00    Types: Cigarettes   Smokeless tobacco: Never  Vaping Use   Vaping Use: Never used  Substance Use Topics   Alcohol use: No   Drug use: No    Home Medications Prior to Admission medications   Medication Sig Start Date End Date Taking? Authorizing Provider  naproxen (NAPROSYN) 500 MG tablet Take 1 tablet (500 mg total) by mouth 2 (two) times daily with a meal. 05/18/20   Chilton Si, Sharion Settler, PA-C  albuterol (PROVENTIL HFA;VENTOLIN HFA) 108 (90 BASE) MCG/ACT inhaler Inhale 2 puffs into the lungs every 4 (four) hours as needed for wheezing. Patient not taking: Reported on 12/15/2017 07/03/12 08/26/19  Glynn Octave, MD    Allergies    Parke Simmers allergy]  Review of Systems   Review of Systems  Constitutional:  Negative for chills, fatigue and fever.  HENT:  Negative for congestion.    Respiratory:  Negative for cough, chest tightness, shortness of breath and wheezing.   Cardiovascular:  Negative for chest pain and palpitations.  Gastrointestinal:  Positive for abdominal pain. Negative for constipation, diarrhea, nausea and vomiting.  Genitourinary:  Positive for frequency, pelvic pain and vaginal bleeding. Negative for dysuria and vaginal pain.  Musculoskeletal:  Positive for back pain. Negative for neck pain and neck stiffness.  Skin:  Negative for rash and wound.  Neurological:  Negative for light-headedness, numbness and headaches.  Psychiatric/Behavioral:  Negative for agitation.   All other systems reviewed and are negative.  Physical Exam Updated Vital Signs LMP 01/24/2021 (Approximate) Comment: new birth control  Physical Exam Vitals and nursing note reviewed.  Constitutional:      General: She is not in acute distress.    Appearance: She is well-developed. She is not ill-appearing, toxic-appearing or diaphoretic.  HENT:     Head: Normocephalic and atraumatic.     Mouth/Throat:     Mouth: Mucous membranes are moist.  Eyes:     Conjunctiva/sclera: Conjunctivae normal.  Cardiovascular:     Rate and Rhythm: Normal rate and regular rhythm.     Heart sounds: No murmur heard. Pulmonary:     Effort: Pulmonary effort is normal. No respiratory distress.     Breath sounds: Normal breath sounds. No wheezing, rhonchi or rales.  Chest:     Chest wall: No  tenderness.  Abdominal:     General: Abdomen is flat.     Palpations: Abdomen is soft.     Tenderness: There is abdominal tenderness. There is no right CVA tenderness, left CVA tenderness, guarding or rebound.  Genitourinary:    Vagina: Bleeding (very mild) present.     Cervix: Cervical bleeding present. No cervical motion tenderness, discharge or erythema.     Uterus: Tender (mild).      Adnexa:        Right: No tenderness.         Left: No tenderness.    Musculoskeletal:        General: Tenderness  present. No signs of injury.     Cervical back: Neck supple. No tenderness.  Skin:    General: Skin is warm and dry.     Capillary Refill: Capillary refill takes less than 2 seconds.     Findings: No erythema.  Neurological:     General: No focal deficit present.     Mental Status: She is alert.    ED Results / Procedures / Treatments   Labs (all labs ordered are listed, but only abnormal results are displayed) Labs Reviewed  WET PREP, GENITAL - Abnormal; Notable for the following components:      Result Value   Trich, Wet Prep PRESENT (*)    WBC, Wet Prep HPF POC MANY (*)    All other components within normal limits  URINALYSIS, ROUTINE W REFLEX MICROSCOPIC - Abnormal; Notable for the following components:   Hgb urine dipstick TRACE (*)    All other components within normal limits  URINALYSIS, MICROSCOPIC (REFLEX) - Abnormal; Notable for the following components:   Bacteria, UA RARE (*)    All other components within normal limits  URINE CULTURE  PREGNANCY, URINE  CBC WITH DIFFERENTIAL/PLATELET  COMPREHENSIVE METABOLIC PANEL  LIPASE, BLOOD  GC/CHLAMYDIA PROBE AMP ([facility]    EKG None  Radiology US PELVIC COMPLETE W TRANSVAGINAL AND TORSION R/O  Result Date: 02/02/2021 CLINICAL DATA:  Pelvic pain and vaginal bleeding. EXAM: TRANSABDOMINAL AND TRANSVAGINAL ULTRASOUND OF PELVIS DOPPLER ULTRASOUND OF OVARIES TECHNIQUE: Both transabdominal and transvaginal ultrasound examinations of the pelvis were performed. Transabdominal technique was performed for global imaging of the pelvis including uterus, ovaries, adnexal regions, and pelvic cul-de-sac. It was necessary to proceed with endovaginal exam following the transabdominal exam to visualize the ovaries. Color and duplex Doppler ultrasound was utilized to evaluate blood flow to the ovaries. COMPARISON:  08/28/2019 FINDINGS: Uterus Measurements: 6.7 x 3.4 x 3.2 cm = volume: 39 mL. No fibroids or other mass  visualized. Endometrium Thickness: 4 mm.  No focal abnormality visualized. Right ovary Measurements: 2.2 x 1.852.0 cm = volume: 4 mL. Normal appearance/no adnexal mass. Left ovary Measurements: 4.0 x 1.7 x 3.7 cm = volume: 13 mL. 4.0 x 2.5 x 3.5 cm complex cyst with suggestion of mural nodularity. Pulsed Doppler evaluation of both ovaries demonstrates normal low-resistance arterial and venous waveforms. Other findings Small volume free fluid in the cul-de-sac. IMPRESSION: 4 cm complex cyst in the left ovary with indeterminate characteristics. Possible mural nodularity. Given that the patient is symptomatic, close follow-up recommended. Consider repeat ultrasound in 6-8 weeks. Pelvic MRI with and without contrast may prove helpful to further evaluate. Electronically Signed   By: Kennith CenterEric  Mansell M.D.   On: 02/02/2021 10:43    Procedures Procedures   Medications Ordered in ED Medications  cefTRIAXone (ROCEPHIN) injection 500 mg (has no administration in  time range)  doxycycline (VIBRA-TABS) tablet 100 mg (has no administration in time range)  metroNIDAZOLE (FLAGYL) tablet 500 mg (has no administration in time range)    ED Course  I have reviewed the triage vital signs and the nursing notes.  Pertinent labs & imaging results that were available during my care of the patient were reviewed by me and considered in my medical decision making (see chart for details).    MDM Rules/Calculators/A&P                          Rose Owens is a 25 y.o. female with a past medical history of starting new birth control medication several weeks ago who presents with lower abdominal cramping for the last 2 weeks, vaginal bleeding, and urinary frequency.  Patient reports that about a month or 2 ago she had similar lower abdominal cramping and was felt it was related to urinary tract infection and patient does report any frequency.  She denies dysuria.  She denies any hematuria or constipation or diarrhea.  She  reports that she started new birth control medication recently and has had some abnormal menstrual bleeding and is been bleeding for the last few weeks.  She does not suspect she is pregnant at this time.  She denies any trauma.  She denies nausea, vomiting, fevers, chills, chest pain, shortness of breath, palpitations.  Notes upper abdominal discomfort.  Reports the pain does go around towards her flanks from her lower central abdomen.  Denies any rashes or any other complaints.  On exam, lungs clear and chest nontender.  Upper abdomen nontender.  Suprapubic area is tender but rest of abdomen is nontender.  No CVA tenderness or flank tenderness.  No rashes seen.  Patient well-appearing.  Will do pelvic exam and get pelvic swabs.  We will get a pregnancy test and urinalysis to look for UTI versus pregnancy.  Due to the painful vaginal bleeding, we will get a pelvic ultrasound after pregnancy test has returned.  We will get screening labs given the several weeks of bleeding and her feeling ill.  Clinically I suspect she is having some painful menstrual bleeding related to her hormone changes with her new medications versus UTI given her history of the same.  Anticipate reassessment after work-up to determine disposition.  8:13 AM On pelvic exam with a chaperone, patient did have some tenderness of the uterus but did not have significant cervical motion tenderness.  No adnexal tenderness or there was a small nonbleeding from the os and in the vaginal vault.  No laceration seen.  No other friability or other abnormality.  Patient did have some what appears to be scars from previous boils and skin infections are in the groin area but there was no palpable boils or tenderness or other acute skin infection seen.  We will get the nonpregnant pelvic ultrasound but I suspect this is all painful menstrual bleeding in the setting of her new oral birth control medications.  Patient does not want to wait for blood  work and does not want to be stuck again.  She refused the blood work.  Ultrasound shows she does have a complex left ovarian cyst but no evidence of TOA.  Suspect her vaginal bleeding and pelvic discomfort is primarily related to the hormone changes with the new PCP however with the discovery of trichomonas, will treat for STD.  The ultrasound did not show evidence of TOA or other acute complication.  Patient will follow-up with OB/GYN for repeat ultrasound testing in 6 to 8 weeks and reassessment and will follow-up on MyChart for the results of the gonorrhea/chlamydia testing.  Patient was given Rocephin, doxycycline, and Flagyl per new guidelines for STD treatment.  Patient understands return precautions and follow-up instructions and was discharged in good condition.  12:39 PM Was just informed by nursing that patient refused the medications here so she did not get the Rocephin to treat for possible other sexually transmitted infections.  Thus she did not get all the blood work we intended and did not get the antibiotics we initially intended but she did take the prescription for the Flagyl and doxycycline which always cover the trichomonas we know she has.  If her STD testing otherwise comes back positive, patient will likely need to be given other medications.    Final Clinical Impression(s) / ED Diagnoses Final diagnoses:  Vaginal bleeding  Trichomonas infection  Pelvic pain  Cyst of left ovary    Rx / DC Orders ED Discharge Orders          Ordered    metroNIDAZOLE (FLAGYL) 500 MG tablet  2 times daily        02/02/21 1136    doxycycline (VIBRAMYCIN) 100 MG capsule  2 times daily        02/02/21 1136            Clinical Impression: 1. Trichomonas infection   2. Vaginal bleeding   3. Pelvic pain   4. Cyst of left ovary     Disposition: Discharge  Condition: Good  I have discussed the results, Dx and Tx plan with the pt(& family if present). He/she/they expressed  understanding and agree(s) with the plan. Discharge instructions discussed at great length. Strict return precautions discussed and pt &/or family have verbalized understanding of the instructions. No further questions at time of discharge.    New Prescriptions   DOXYCYCLINE (VIBRAMYCIN) 100 MG CAPSULE    Take 1 capsule (100 mg total) by mouth 2 (two) times daily for 7 days.   METRONIDAZOLE (FLAGYL) 500 MG TABLET    Take 1 tablet (500 mg total) by mouth 2 (two) times daily for 7 days.    Follow Up: your [facility] [address] [place] [address] [phone] Schedule an appointment as soon as possible for a visit    [facility] [place] [facility] [address] [id number] AT [address] [place] [address] [phone]       Ellen Mayol, Canary Brim, MD 02/02/21 1141    Tytan Sandate, Canary Brim, MD 02/02/21 1240

## 2021-02-02 NOTE — ED Triage Notes (Signed)
Patient complaining of menstrual like cramping for 2 weeks. Causing some back pain and associated headache. Recently started Cryselle oral birth control. Has been spotting for the last 2 weeks.

## 2021-02-02 NOTE — ED Notes (Signed)
Was informed by director to not discharge the patient from the system as she would be returning for her treatment.

## 2021-02-02 NOTE — Discharge Instructions (Addendum)
Your work-up today is consistent with vaginal bleeding and pelvic discomfort likely related to your menstrual cycle changing with your new birth control medication however we did find evidence of a sexually transmitted infection called trichomonas.  With this, you were treated with antibiotics to cover other possible sexual transmitted infections as well and the labs were sent.  The ultrasound shows you have a left complex ovarian cyst which need to follow-up with your OB/GYN for for likely repeat ultrasound in 6 to 8 weeks.  Initially, we wanted to get blood work to rule out worsening anemia or other electrolyte abnormalities but you did not want to get restock or wait for blood work which given your reassuring vital signs is reasonable however I do want you to follow-up with a primary doctor as well.  Please take the antibiotics to treat for psychiatric infections for the next week and rest and stay hydrated.  If any symptoms change or worsen acutely, please return to the nearest emergency department.

## 2021-02-02 NOTE — ED Notes (Signed)
Patient politely refusing VS at this time. No sign of distress noted.

## 2021-02-02 NOTE — ED Notes (Signed)
Upon discharge, patient not willing to wait for AVS instruction. Very hostile with language toward staff. Took patient stickers. Nurse requested stickers back for administration purposes. Patient states she will give them to the front desk. Walked with patient to registration window. Registration staff stated they did not need stickers, and patient became very angry, not realizing this was the wrong desk mentioned before. Patient left while continuing to verbalize under her breath where individual statements were unrecognizeable.   Per front desk staff: patient returned asking to speak to another doctor that was not the doctor who cared for her today. Also wanting to speak to the charge nurse that was not the "nurse who followed" her to the check out window.

## 2021-02-02 NOTE — ED Notes (Signed)
ED MD informed of unable to obtain blood for labs

## 2021-02-03 LAB — URINE CULTURE: Culture: <10000 — AB

## 2021-06-17 IMAGING — CT CT ABD-PELV W/ CM
2 of 5 series · 15 of 46 positions shown, 17 images · IV contrast (Omnipaque)
Comparison: CT dated 12/15/2017.

CLINICAL DATA: 24-year-old female with perianal abscess.

EXAM:
CT ABDOMEN AND PELVIS WITH CONTRAST
TECHNIQUE: Multidetector CT imaging of the abdomen and pelvis was performed
using the standard protocol following bolus administration of
intravenous contrast.
CONTRAST:  100mL OMNIPAQUE IOHEXOL 300 MG/ML  SOLN

[Series 2: axial st · axial · 0.91mm/px · z∈[-583,-118]mm · 12 of 107 slices shown, 14 images]
[im 7/107  soft-tissue]
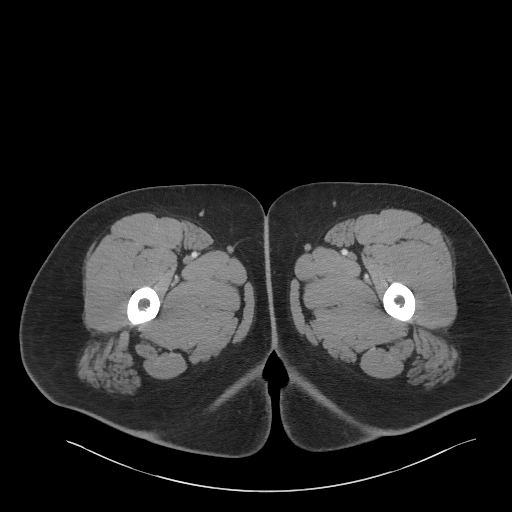
[im 7/107  bone]
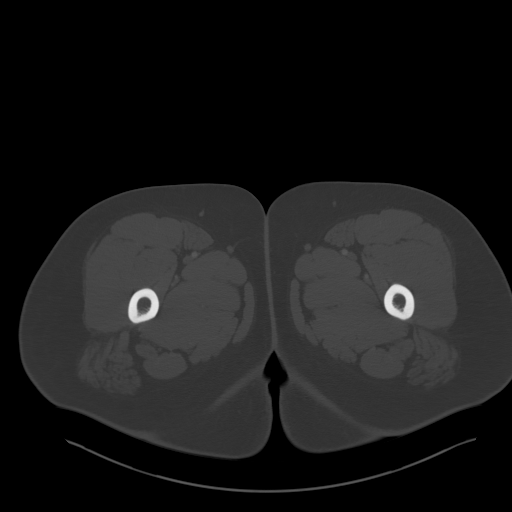
[im 19/107  soft-tissue]
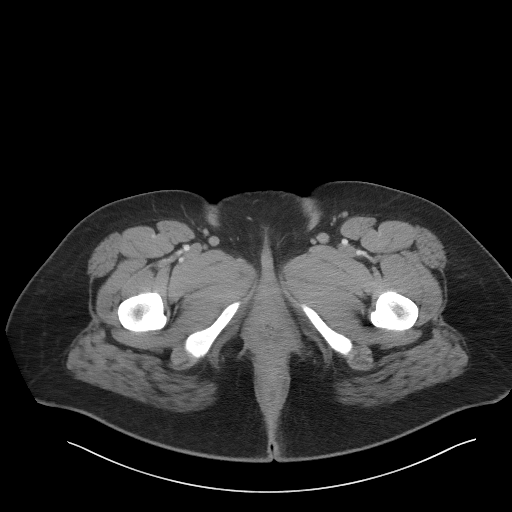
[im 25/107  soft-tissue]
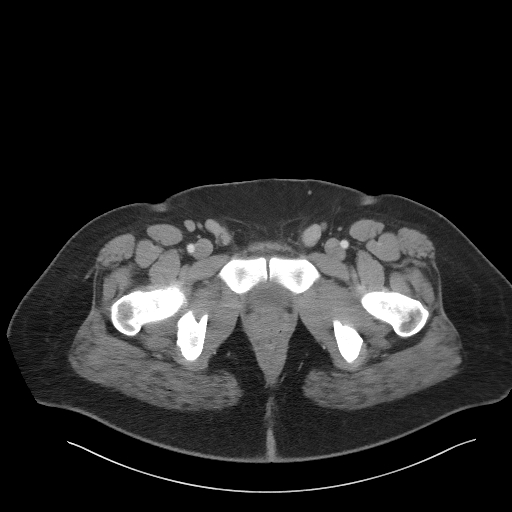
[im 32/107  soft-tissue]
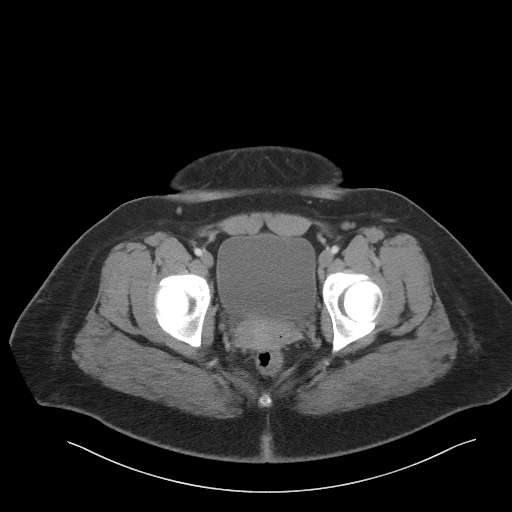
[im 44/107  soft-tissue]
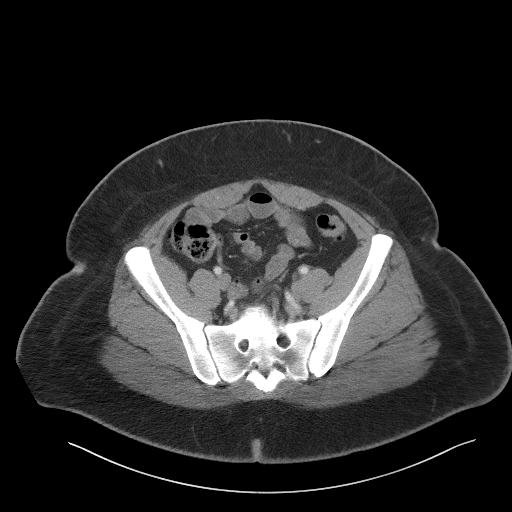
[im 50/107  soft-tissue]
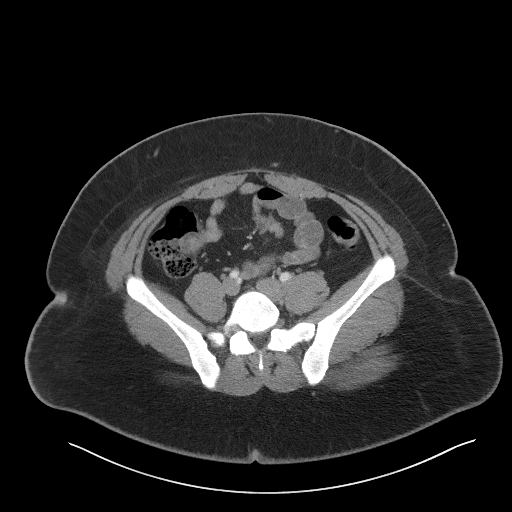
[im 57/107  soft-tissue]
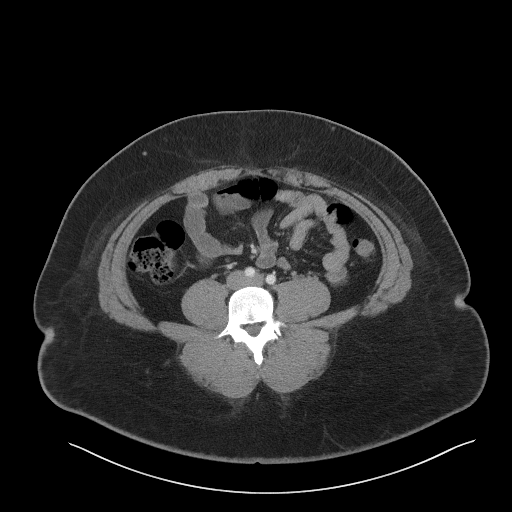
[im 69/107  soft-tissue]
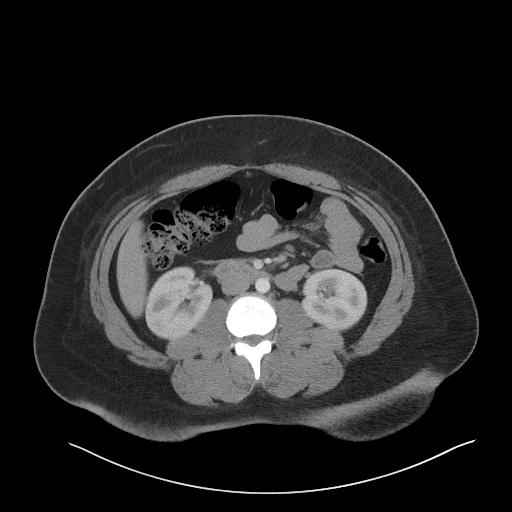
[im 75/107  soft-tissue]
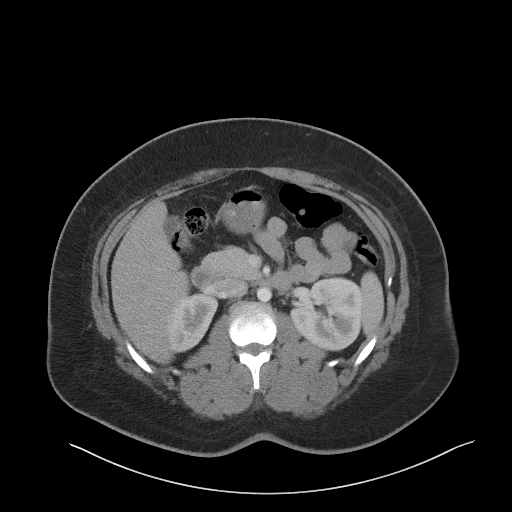
[im 75/107  bone]
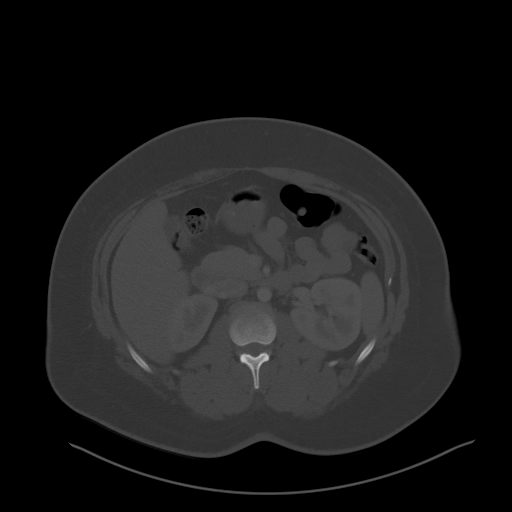
[im 82/107  soft-tissue]
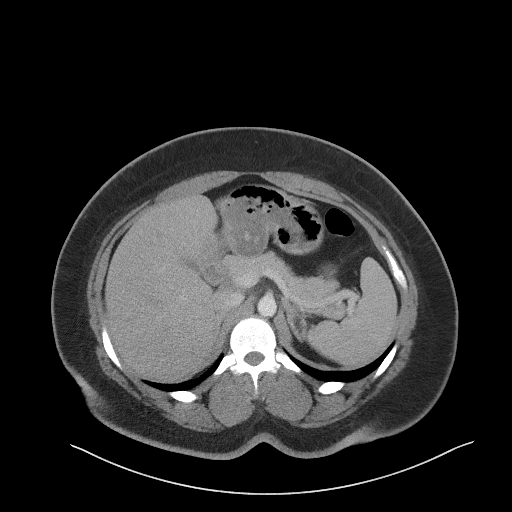
[im 94/107  soft-tissue]
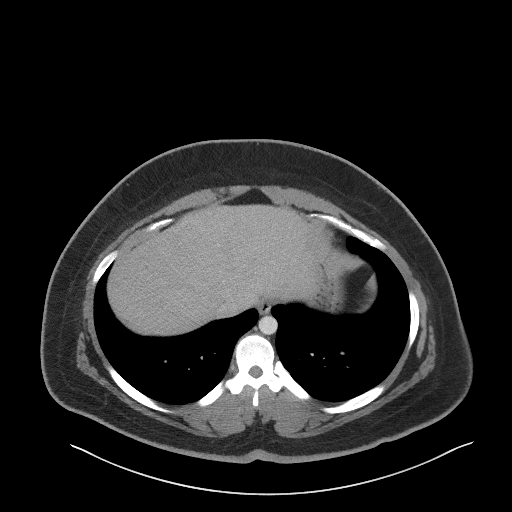
[im 100/107  soft-tissue]
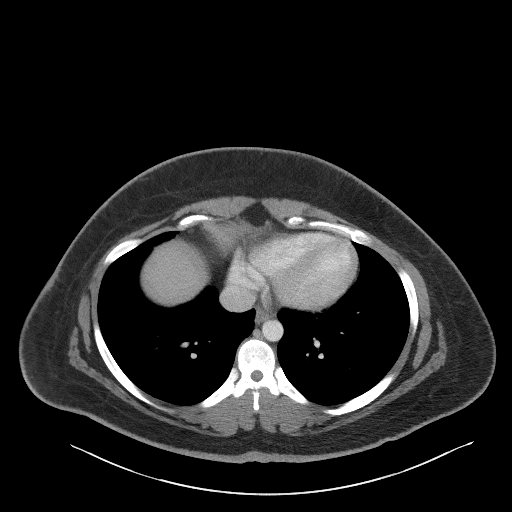

[Series 5: coronal st · coronal · 0.78mm/px · 3 of 114 slices shown]
[im 38/114  soft-tissue]
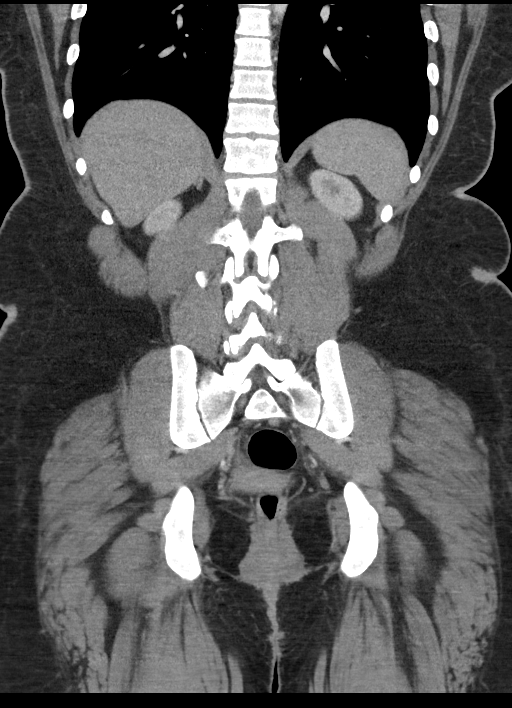
[im 51/114  soft-tissue]
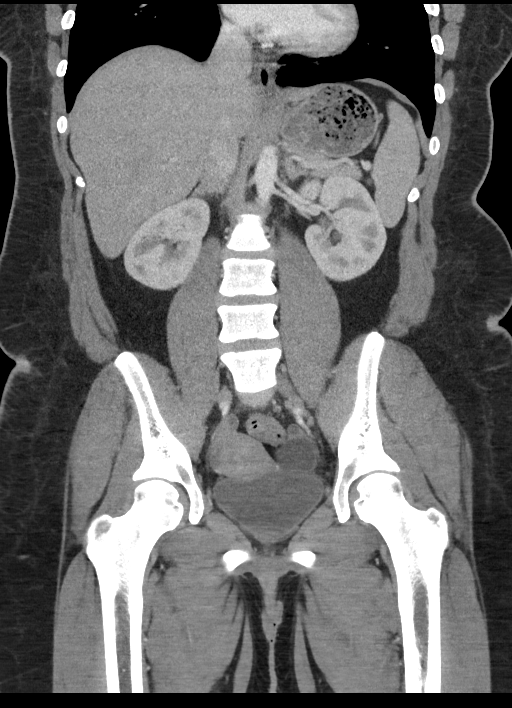
[im 63/114  soft-tissue]
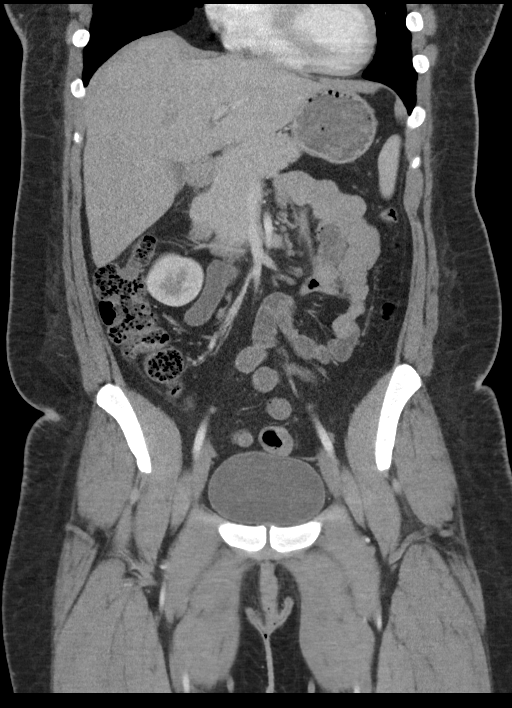

[15 of 46 positions shown; findings below may reference images not displayed]

FINDINGS: Lower chest: The visualized lung bases are clear.

No intra-abdominal free air or free fluid.

Hepatobiliary: Probable mild fatty liver. No intrahepatic biliary
ductal dilatation. The gallbladder is unremarkable.

Pancreas: Unremarkable. No pancreatic ductal dilatation or
surrounding inflammatory changes.

Spleen: Normal in size without focal abnormality.

Adrenals/Urinary Tract: Adrenal glands are unremarkable. Kidneys are
normal, without renal calculi, focal lesion, or hydronephrosis.
Bladder is unremarkable.

Stomach/Bowel: There is no bowel obstruction or active inflammation.
The appendix is normal.

Vascular/Lymphatic: The abdominal aorta and IVC unremarkable. No
portal venous gas. There is no adenopathy.

Reproductive: The uterus is anteverted and grossly unremarkable. A
3.5 cm cystic structure in the region of the left adnexa may
represent likely a cyst and less likely dilated fallopian tube.
Ultrasound may provide better evaluation of the pelvic structures if
clinically indicated.

Other: There is mild induration and thickening of the posterior
perianal tissues with a faint 9 mm hypodense focus posterior to the
anus along the intergluteal cleft, likely a small phlegmon or focal
edema. No drainable fluid collection identified. No soft tissue gas.

Musculoskeletal: No acute or significant osseous findings.
IMPRESSION: 1. Mild thickening of the posterior perianal tissues with a
subcentimeter focal edema or phlegmon along the intergluteal cleft.
No drainable fluid collection identified. No soft tissue gas.
2. No bowel obstruction. Normal appendix.

## 2021-07-13 DIAGNOSIS — R197 Diarrhea, unspecified: Secondary | ICD-10-CM | POA: Insufficient documentation

## 2021-07-13 DIAGNOSIS — Z20822 Contact with and (suspected) exposure to covid-19: Secondary | ICD-10-CM | POA: Insufficient documentation

## 2021-07-13 DIAGNOSIS — F17210 Nicotine dependence, cigarettes, uncomplicated: Secondary | ICD-10-CM | POA: Insufficient documentation

## 2021-07-13 DIAGNOSIS — R509 Fever, unspecified: Secondary | ICD-10-CM | POA: Diagnosis present

## 2021-07-13 DIAGNOSIS — J988 Other specified respiratory disorders: Secondary | ICD-10-CM | POA: Diagnosis not present

## 2021-07-14 ENCOUNTER — Emergency Department (HOSPITAL_BASED_OUTPATIENT_CLINIC_OR_DEPARTMENT_OTHER)
Admission: EM | Admit: 2021-07-14 | Discharge: 2021-07-14 | Disposition: A | Discharge status: Home / Self Care | Payer: BC Managed Care – PPO | Attending: Emergency Medicine | Admitting: Emergency Medicine

## 2021-07-14 ENCOUNTER — Encounter (HOSPITAL_BASED_OUTPATIENT_CLINIC_OR_DEPARTMENT_OTHER): Payer: Self-pay | Admitting: *Deleted

## 2021-07-14 ENCOUNTER — Other Ambulatory Visit: Payer: Self-pay

## 2021-07-14 DIAGNOSIS — B9789 Other viral agents as the cause of diseases classified elsewhere: Secondary | ICD-10-CM

## 2021-07-14 LAB — PREGNANCY, URINE: Preg Test, Ur: NEGATIVE

## 2021-07-14 LAB — RESP PANEL BY RT-PCR (FLU A&B, COVID) ARPGX2
Influenza A by PCR: NEGATIVE
Influenza B by PCR: NEGATIVE
SARS Coronavirus 2 by RT PCR: NEGATIVE

## 2021-07-14 NOTE — Discharge Instructions (Addendum)
You may take over-the-counter medicine for symptomatic relief, such as Tylenol, Motrin, TheraFlu, Alka seltzer , black elderberry, etc. Please limit acetaminophen (Tylenol) to 4000 mg and Ibuprofen (Motrin, Advil, etc.) to 2400 mg for a 24hr period. Please note that other over-the-counter medicine may contain acetaminophen or ibuprofen as a component of their ingredients.   

## 2021-07-14 NOTE — ED Provider Notes (Signed)
[facility] EMERGENCY DEPARTMENT Provider Note  CSN: [id number] Arrival date & time: 07/13/21 2357  Chief Complaint(s) Covid Exposure  HPI Rose Owens is a 25 y.o. female    Illness Location:  Generalized Quality:  Covid-like symptoms Severity:  Moderate Onset quality:  Gradual Duration:  1 week Timing:  Constant Progression:  Improving Chronicity:  New Context:  Reports having COVID exposure at work Relieved by:  OTC medications Worsened by:  Nothing Associated symptoms: congestion, cough, diarrhea (improved), headaches, myalgias, nausea, rhinorrhea, sore throat and vomiting   Associated symptoms: no abdominal pain and no chest pain    Past Medical History Past Medical History:  Diagnosis Date   Vaginal bleeding    There are no problems to display for this patient.  Home Medication(s) Prior to Admission medications   Medication Sig Start Date End Date Taking? Authorizing Provider  naproxen (NAPROSYN) 500 MG tablet Take 1 tablet (500 mg total) by mouth 2 (two) times daily with a meal. 05/18/20   Chilton Si, Sharion Settler, PA-C  albuterol (PROVENTIL HFA;VENTOLIN HFA) 108 (90 BASE) MCG/ACT inhaler Inhale 2 puffs into the lungs every 4 (four) hours as needed for wheezing. Patient not taking: Reported on 12/15/2017 07/03/12 08/26/19  Glynn Octave, MD                                                                                                                                    Past Surgical History Past Surgical History:  Procedure Laterality Date   TONSILLECTOMY     Family History No family history on file.  Social History Social History   Tobacco Use   Smoking status: Every Day    Packs/day: 0.50    Types: Cigarettes   Smokeless tobacco: Never  Vaping Use   Vaping Use: Never used  Substance Use Topics   Alcohol use: No   Drug use: No   Allergies Crab [shellfish allergy]  Review of Systems Review of Systems  HENT:  Positive for congestion,  rhinorrhea and sore throat.   Respiratory:  Positive for cough.   Cardiovascular:  Negative for chest pain.  Gastrointestinal:  Positive for diarrhea (improved), nausea and vomiting. Negative for abdominal pain.  Musculoskeletal:  Positive for myalgias.  Neurological:  Positive for headaches.  All other systems are reviewed and are negative for acute change except as noted in the HPI  Physical Exam Vital Signs  I have reviewed the triage vital signs BP 129/68   Pulse 76   Temp 98 F (36.7 C) (Oral)   Resp 18   Ht 5\' 2"  (1.575 m)   Wt 99.8 kg   LMP 06/02/2021   SpO2 100%   BMI 40.24 kg/m   Physical Exam Vitals reviewed.  Constitutional:      General: She is not in acute distress.    Appearance: She is well-developed. She is not diaphoretic.  HENT:     Head: Normocephalic and  atraumatic.     Nose: Mucosal edema, congestion and rhinorrhea present.     Comments: Post nasal drip Eyes:     General: No scleral icterus.       Right eye: No discharge.        Left eye: No discharge.     Conjunctiva/sclera: Conjunctivae normal.     Pupils: Pupils are equal, round, and reactive to light.  Cardiovascular:     Rate and Rhythm: Normal rate and regular rhythm.     Heart sounds: No murmur heard.   No friction rub. No gallop.  Pulmonary:     Effort: Pulmonary effort is normal. No respiratory distress.     Breath sounds: Normal breath sounds. No stridor. No rales.  Abdominal:     General: There is no distension.     Palpations: Abdomen is soft.     Tenderness: There is no abdominal tenderness.  Musculoskeletal:        General: No tenderness.     Cervical back: Normal range of motion and neck supple.  Skin:    General: Skin is warm and dry.     Findings: No erythema or rash.  Neurological:     Mental Status: She is alert and oriented to person, place, and time.    ED Results and Treatments Labs (all labs ordered are listed, but only abnormal results are displayed) Labs  Reviewed  RESP PANEL BY RT-PCR (FLU A&B, COVID) ARPGX2  PREGNANCY, URINE                                                                                                                         EKG  EKG Interpretation  Date/Time:    Ventricular Rate:    PR Interval:    QRS Duration:   QT Interval:    QTC Calculation:   R Axis:     Text Interpretation:         Radiology No results found.  Pertinent labs & imaging results that were available during my care of the patient were reviewed by me and considered in my medical decision making (see MDM for details).  Medications Ordered in ED Medications - No data to display                                                                                                                                   Procedures Procedures  (including  critical care time)  Medical Decision Making / ED Course I have reviewed the nursing notes for this encounter and the patient's prior records (if available in EHR or on provided paperwork).  Rose Owens was evaluated in Emergency Department on 07/14/2021 for the symptoms described in the history of present illness. She was evaluated in the context of the global COVID-19 pandemic, which necessitated consideration that the patient might be at risk for infection with the SARS-CoV-2 virus that causes COVID-19. Institutional protocols and algorithms that pertain to the evaluation of patients at risk for COVID-19 are in a state of rapid change based on information released by regulatory bodies including the CDC and federal and state organizations. These policies and algorithms were followed during the patient's care in the ED.     Patient presents with viral symptoms for 1 week. Aequate oral hydration. Rest of history as above.  Patient appears well. No signs of toxicity, patient is interactive. No hypoxia, tachypnea or other signs of respiratory distress. No sign of clinical dehydration. Lung exam clear. Abd  benign. Rest of exam as above.  Most consistent with viral illness. COVID/Flu negative.  No evidence suggestive of pharyngitis, AOM, PNA, or meningitis.  Chest x-ray not indicated at this time.  Discussed symptomatic treatment with the patient and they will follow closely with their PCP.   Pertinent labs & imaging results that were available during my care of the patient were reviewed by me and considered in my medical decision making:    Final Clinical Impression(s) / ED Diagnoses Final diagnoses:  Viral respiratory illness   The patient appears reasonably screened and/or stabilized for discharge and I doubt any other medical condition or other [facility] requiring further screening, evaluation, or treatment in the ED at this time prior to discharge. Safe for discharge with strict return precautions.  Disposition: Discharge  Condition: Good  I have discussed the results, Dx and Tx plan with the patient/family who expressed understanding and agree(s) with the plan. Discharge instructions discussed at length. The patient/family was given strict return precautions who verbalized understanding of the instructions. No further questions at time of discharge.    ED Discharge Orders     None         Follow Up: Primary care provider  Call  to schedule an appointment for close follow up     This chart was dictated using voice recognition software.  Despite best efforts to proofread,  errors can occur which can change the documentation meaning.    Nira Conn, MD 07/14/21 [phone]

## 2021-07-14 NOTE — ED Triage Notes (Signed)
C/o covid sx x 1 week , fever, congestion , h/a diarrhea

## 2021-09-23 ENCOUNTER — Encounter (HOSPITAL_BASED_OUTPATIENT_CLINIC_OR_DEPARTMENT_OTHER): Payer: Self-pay | Admitting: Emergency Medicine

## 2021-09-23 ENCOUNTER — Emergency Department (HOSPITAL_BASED_OUTPATIENT_CLINIC_OR_DEPARTMENT_OTHER)
Admission: EM | Admit: 2021-09-23 | Discharge: 2021-09-23 | Disposition: A | Discharge status: Home / Self Care | Payer: BC Managed Care – PPO | Attending: Emergency Medicine | Admitting: Emergency Medicine

## 2021-09-23 ENCOUNTER — Other Ambulatory Visit: Payer: Self-pay

## 2021-09-23 DIAGNOSIS — J069 Acute upper respiratory infection, unspecified: Secondary | ICD-10-CM

## 2021-09-23 DIAGNOSIS — Z20822 Contact with and (suspected) exposure to covid-19: Secondary | ICD-10-CM | POA: Insufficient documentation

## 2021-09-23 DIAGNOSIS — R0981 Nasal congestion: Secondary | ICD-10-CM | POA: Diagnosis present

## 2021-09-23 LAB — RESP PANEL BY RT-PCR (FLU A&B, COVID) ARPGX2
Influenza A by PCR: NEGATIVE
Influenza B by PCR: NEGATIVE
SARS Coronavirus 2 by RT PCR: NEGATIVE

## 2021-09-23 MED ORDER — BENZONATATE 200 MG PO CAPS: 200.0000 mg | ORAL_CAPSULE | Freq: Three times a day (TID) | ORAL | 0 refills | Status: AC

## 2021-09-23 NOTE — ED Provider Notes (Signed)
[facility] EMERGENCY DEPARTMENT Provider Note   CSN: [id number] Arrival date & time: 09/23/21  2139     History  Chief Complaint  Patient presents with   Cough    Rose Owens is a 26 y.o. female.  37-year-old female presents with complaint of runny nose, cough x1 week.  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  No history of asthma or chronic lung disease.      Home Medications Prior to Admission medications   Medication Sig Start Date End Date Taking? Authorizing Provider  benzonatate (TESSALON) 200 MG capsule Take 1 capsule (200 mg total) by mouth every 8 (eight) hours for 10 days. 09/23/21 10/03/21 Yes Jeannie Fend, PA-C  naproxen (NAPROSYN) 500 MG tablet Take 1 tablet (500 mg total) by mouth 2 (two) times daily with a meal. 05/18/20   Chilton Si, Sharion Settler, PA-C  albuterol (PROVENTIL HFA;VENTOLIN HFA) 108 (90 BASE) MCG/ACT inhaler Inhale 2 puffs into the lungs every 4 (four) hours as needed for wheezing. Patient not taking: Reported on 12/15/2017 07/03/12 08/26/19  Glynn Octave, MD      Allergies    Parke Simmers allergy]    Review of Systems   Review of Systems Negative except as per HPI Physical Exam Updated Vital Signs BP (!) 144/78 (BP Location: Right Arm)    Pulse 66    Temp 98 F (36.7 C) (Oral)    Resp 18    Ht 5\' 2"  (1.575 m)    Wt 100.7 kg    LMP 09/03/2021    SpO2 100%    BMI 40.60 kg/m  Physical Exam Vitals and nursing note reviewed.  Constitutional:      General: She is not in acute distress.    Appearance: She is well-developed. She is not diaphoretic.  HENT:     Head: Normocephalic and atraumatic.     Right Ear: Tympanic membrane and ear canal normal.     Left Ear: Tympanic membrane and ear canal normal.     Nose: No congestion or rhinorrhea.     Mouth/Throat:     Mouth: Mucous membranes are moist.     Pharynx: Posterior oropharyngeal erythema present. No  oropharyngeal exudate.  Eyes:     Conjunctiva/sclera: Conjunctivae normal.  Cardiovascular:     Rate and Rhythm: Normal rate and regular rhythm.     Heart sounds: Normal heart sounds.  Pulmonary:     Effort: Pulmonary effort is normal.     Breath sounds: Normal breath sounds.  Skin:    General: Skin is warm and dry.  Neurological:     Mental Status: She is alert and oriented to person, place, and time.  Psychiatric:        Behavior: Behavior normal.    ED Results / Procedures / Treatments   Labs (all labs ordered are listed, but only abnormal results are displayed) Labs Reviewed  RESP PANEL BY RT-PCR (FLU A&B, COVID) ARPGX2    EKG None  Radiology No results found.  Procedures Procedures    Medications Ordered in ED Medications - No data to display  ED Course/ Medical Decision Making/ A&P                           Medical Decision Making  26 year old female with URI symptoms x1 week.  On exam, has mild posterior pharyngeal erythema with mild postnasal  drip.  Lungs clear to auscultation. Patient is afebrile with O2 sat 100% on room air.  Plan is to send COVID/flu swab.  Recommend follow-up in her MyChart for her results.  Given prescription for Tessalon for her cough.  Can also use Flonase and Zyrtec for symptom relief.        Final Clinical Impression(s) / ED Diagnoses Final diagnoses:  Viral URI with cough    Rx / DC Orders ED Discharge Orders          Ordered    benzonatate (TESSALON) 200 MG capsule  Every 8 hours        09/23/21 2203              Jeannie Fend, PA-C 09/23/21 2207    Gwyneth Sprout, MD 09/24/21 2315

## 2021-09-23 NOTE — Discharge Instructions (Addendum)
Follow up in your MyChart for your COVID/flu test results. Tessalon as needed as prescribed for cough. Can also take Zyrtec and Flonase for your runny nose.

## 2021-09-23 NOTE — ED Triage Notes (Signed)
Reports runny nose and cough for a week.  Taking otc flu and cold meds with no relief.

## 2021-09-23 NOTE — ED Notes (Signed)
Pt A&O4 ambulatory at d/c with independent steady gait, NAD. Pt verbalized understanding of d/c instructions, prescription and follow up care.

## 2022-08-02 ENCOUNTER — Encounter (HOSPITAL_BASED_OUTPATIENT_CLINIC_OR_DEPARTMENT_OTHER): Payer: Self-pay | Admitting: Emergency Medicine

## 2022-08-02 ENCOUNTER — Other Ambulatory Visit: Payer: Self-pay

## 2022-08-02 DIAGNOSIS — K649 Unspecified hemorrhoids: Secondary | ICD-10-CM | POA: Insufficient documentation

## 2022-08-02 NOTE — ED Triage Notes (Signed)
Abscess on anus since last worse last 2 days. Has Hx of same. States has drained some but still getting bigger.

## 2022-08-03 ENCOUNTER — Emergency Department (HOSPITAL_BASED_OUTPATIENT_CLINIC_OR_DEPARTMENT_OTHER)
Admission: EM | Admit: 2022-08-03 | Discharge: 2022-08-03 | Disposition: A | Discharge status: Home / Self Care | Payer: BC Managed Care – PPO | Attending: Emergency Medicine | Admitting: Emergency Medicine

## 2022-08-03 DIAGNOSIS — K649 Unspecified hemorrhoids: Secondary | ICD-10-CM

## 2022-08-03 MED ORDER — HYDROCORTISONE 1 % EX CREA
TOPICAL_CREAM | Freq: Once | CUTANEOUS | Status: AC
  Filled 2022-08-03: qty 28

## 2022-08-03 NOTE — ED Provider Notes (Signed)
   [facility] EMERGENCY DEPARTMENT  Provider Note  CSN: [id number] Arrival date & time: 08/02/22 2111  History Chief Complaint  Patient presents with   Abscess    Rose Owens is a 26 y.o. female with history of perianal abscess drained in the ED in Mar 2022 reports 3-4 days of rectal pain and swelling. States it 'busted open' and drained blood but closed up and became more swollen. No fevers. Pain with defecation.    Home Medications Prior to Admission medications   Medication Sig Start Date End Date Taking? Authorizing Provider  naproxen (NAPROSYN) 500 MG tablet Take 1 tablet (500 mg total) by mouth 2 (two) times daily with a meal. 05/18/20   Chilton Si, Sharion Settler, PA-C  albuterol (PROVENTIL HFA;VENTOLIN HFA) 108 (90 BASE) MCG/ACT inhaler Inhale 2 puffs into the lungs every 4 (four) hours as needed for wheezing. Patient not taking: Reported on 12/15/2017 07/03/12 08/26/19  Glynn Octave, MD     Allergies    Parke Simmers allergy]   Review of Systems   Review of Systems Please see HPI for pertinent positives and negatives  Physical Exam BP 118/82 (BP Location: Right Arm)   Pulse 74   Temp 98.1 F (36.7 C) (Oral)   Resp 18   Ht 5\' 2"  (1.575 m)   Wt 99.8 kg   LMP 06/16/2022 (Exact Date)   SpO2 100%   BMI 40.24 kg/m   Physical Exam Vitals and nursing note reviewed.  HENT:     Head: Normocephalic.     Nose: Nose normal.  Eyes:     Extraocular Movements: Extraocular movements intact.  Pulmonary:     Effort: Pulmonary effort is normal.  Genitourinary:    Comments: Chaperone present. There is a large hemorrhoid which shows evidence of recent rupture, tender to palpation but no erythema, induration or signs of infection Musculoskeletal:        General: Normal range of motion.     Cervical back: Neck supple.  Skin:    Findings: No rash (on exposed skin).  Neurological:     Mental Status: She is alert and oriented to person, place, and time.   Psychiatric:        Mood and Affect: Mood normal.     ED Results / Procedures / Treatments   EKG None  Procedures Procedures  Medications Ordered in the ED Medications  hydrocortisone cream 1 % (has no administration in time range)    Initial Impression and Plan  Patient here with a large hemorrhoid, recently ruptured but now more painful. I do not see evidence of infection. Will recommend she use topical hemorrhoid treatments and follow up with Gen Surg for re-assessment if not improving.   ED Course       MDM Rules/Calculators/A&P Medical Decision Making Problems Addressed: Hemorrhoids, unspecified hemorrhoid type: acute illness or injury  Risk OTC drugs.    Final Clinical Impression(s) / ED Diagnoses Final diagnoses:  Hemorrhoids, unspecified hemorrhoid type    Rx / DC Orders ED Discharge Orders     None        13/11/2021, MD 08/03/22 [phone]

## 2022-08-03 NOTE — ED Notes (Signed)
Assumed care of pt, she is alert and oriented.  Pt reports "the boil on my butt is back and it hurts."

## 2024-06-15 ENCOUNTER — Emergency Department (HOSPITAL_BASED_OUTPATIENT_CLINIC_OR_DEPARTMENT_OTHER)
Admission: EM | Admit: 2024-06-15 | Discharge: 2024-06-15 | Discharge status: Against Medical Advice | Attending: Emergency Medicine | Admitting: Emergency Medicine

## 2024-06-15 ENCOUNTER — Emergency Department (HOSPITAL_BASED_OUTPATIENT_CLINIC_OR_DEPARTMENT_OTHER)

## 2024-06-15 ENCOUNTER — Other Ambulatory Visit: Payer: Self-pay

## 2024-06-15 DIAGNOSIS — Y9339 Activity, other involving climbing, rappelling and jumping off: Secondary | ICD-10-CM | POA: Insufficient documentation

## 2024-06-15 DIAGNOSIS — X58XXXA Exposure to other specified factors, initial encounter: Secondary | ICD-10-CM | POA: Diagnosis not present

## 2024-06-15 DIAGNOSIS — S8012XA Contusion of left lower leg, initial encounter: Secondary | ICD-10-CM | POA: Diagnosis present

## 2024-06-15 DIAGNOSIS — Z5321 Procedure and treatment not carried out due to patient leaving prior to being seen by health care provider: Secondary | ICD-10-CM | POA: Diagnosis not present

## 2024-06-15 NOTE — ED Triage Notes (Signed)
 After triage pt reports that she does not want to wait to be seen.

## 2024-06-15 NOTE — ED Triage Notes (Addendum)
 Pt reports jumping fence yesterday, report LLE swelling, bruising since. Denies any lacerations.
